# Patient Record
Sex: Female | Born: 1960 | Race: White | Hispanic: No | Smoking: Current every day smoker
Health system: Southern US, Community
[De-identification: ages and names within clinical notes are randomized; demographics above are authoritative.]

## PROBLEM LIST (undated history)

## (undated) DIAGNOSIS — F32A Depression, unspecified: Secondary | ICD-10-CM

## (undated) DIAGNOSIS — M2550 Pain in unspecified joint: Secondary | ICD-10-CM

## (undated) DIAGNOSIS — F17200 Nicotine dependence, unspecified, uncomplicated: Secondary | ICD-10-CM

## (undated) DIAGNOSIS — J45909 Unspecified asthma, uncomplicated: Secondary | ICD-10-CM

## (undated) DIAGNOSIS — M1990 Unspecified osteoarthritis, unspecified site: Secondary | ICD-10-CM

## (undated) DIAGNOSIS — H269 Unspecified cataract: Secondary | ICD-10-CM

## (undated) DIAGNOSIS — T8859XA Other complications of anesthesia, initial encounter: Secondary | ICD-10-CM

## (undated) DIAGNOSIS — F329 Major depressive disorder, single episode, unspecified: Secondary | ICD-10-CM

## (undated) DIAGNOSIS — R110 Nausea: Secondary | ICD-10-CM

## (undated) DIAGNOSIS — T4145XA Adverse effect of unspecified anesthetic, initial encounter: Secondary | ICD-10-CM

## (undated) HISTORY — DX: Depression, unspecified: F32.A

## (undated) HISTORY — DX: Unspecified asthma, uncomplicated: J45.909

## (undated) HISTORY — DX: Unspecified osteoarthritis, unspecified site: M19.90

## (undated) HISTORY — DX: Nausea: R11.0

## (undated) HISTORY — PX: CATARACT EXTRACTION W/ INTRAOCULAR LENS IMPLANT: SHX1309

## (undated) HISTORY — DX: Nicotine dependence, unspecified, uncomplicated: F17.200

## (undated) HISTORY — DX: Unspecified cataract: H26.9

## (undated) HISTORY — DX: Major depressive disorder, single episode, unspecified: F32.9

## (undated) HISTORY — PX: TONSILLECTOMY: SUR1361

## (undated) HISTORY — DX: Pain in unspecified joint: M25.50

---
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
## 1979-09-28 HISTORY — PX: APPENDECTOMY: SHX54

## 1998-09-27 HISTORY — PX: ABDOMINAL HYSTERECTOMY: SHX81

## 2014-06-06 ENCOUNTER — Ambulatory Visit: Payer: Self-pay

## 2014-06-06 DIAGNOSIS — R079 Chest pain, unspecified: Secondary | ICD-10-CM

## 2015-10-31 ENCOUNTER — Encounter: Payer: Self-pay | Admitting: Family Medicine

## 2015-10-31 ENCOUNTER — Ambulatory Visit (INDEPENDENT_AMBULATORY_CARE_PROVIDER_SITE_OTHER): Payer: Self-pay | Admitting: Family Medicine

## 2015-10-31 VITALS — BP 137/85 | HR 88 | Temp 98.8°F | Resp 16 | Ht 67.0 in | Wt 196.0 lb

## 2015-10-31 DIAGNOSIS — N6452 Nipple discharge: Secondary | ICD-10-CM

## 2015-10-31 DIAGNOSIS — J4520 Mild intermittent asthma, uncomplicated: Secondary | ICD-10-CM

## 2015-10-31 DIAGNOSIS — Z87898 Personal history of other specified conditions: Secondary | ICD-10-CM

## 2015-10-31 DIAGNOSIS — F32A Depression, unspecified: Secondary | ICD-10-CM | POA: Insufficient documentation

## 2015-10-31 DIAGNOSIS — J301 Allergic rhinitis due to pollen: Secondary | ICD-10-CM | POA: Insufficient documentation

## 2015-10-31 DIAGNOSIS — Z8679 Personal history of other diseases of the circulatory system: Secondary | ICD-10-CM | POA: Insufficient documentation

## 2015-10-31 DIAGNOSIS — F329 Major depressive disorder, single episode, unspecified: Secondary | ICD-10-CM

## 2015-10-31 DIAGNOSIS — E785 Hyperlipidemia, unspecified: Secondary | ICD-10-CM

## 2015-10-31 DIAGNOSIS — E559 Vitamin D deficiency, unspecified: Secondary | ICD-10-CM | POA: Insufficient documentation

## 2015-10-31 DIAGNOSIS — N63 Unspecified lump in unspecified breast: Secondary | ICD-10-CM

## 2015-10-31 DIAGNOSIS — Z720 Tobacco use: Secondary | ICD-10-CM

## 2015-10-31 DIAGNOSIS — R635 Abnormal weight gain: Secondary | ICD-10-CM

## 2015-10-31 MED ORDER — ALBUTEROL SULFATE HFA 108 (90 BASE) MCG/ACT IN AERS: 2.0000 | INHALATION_SPRAY | Freq: Four times a day (QID) | RESPIRATORY_TRACT | Status: DC | PRN

## 2015-10-31 MED ORDER — OLOPATADINE HCL 0.1 % OP SOLN: 1.0000 [drp] | Freq: Two times a day (BID) | OPHTHALMIC | Status: DC

## 2015-10-31 MED ORDER — CITALOPRAM HYDROBROMIDE 20 MG PO TABS: 20.0000 mg | ORAL_TABLET | Freq: Every day | ORAL | Status: DC

## 2015-10-31 NOTE — Progress Notes (Signed)
Subjective:    Patient ID: Jacqueline [name], female    DOB: 1960-10-28, 55 y.o.   MRN: [id number]  HPI: Jacqueline [name] is a 55 y.o. female presenting on 10/31/2015 for Establish Care   HPI  Pt presents to establish care today. Previous care provider was in [place] [facility]-saw family medicine in [place] for acute care visits. .  It has been 3 years since Her last PCP visit. Records from previous provider will be requested and reviewed. Current medical problems include:  Allergies- Environmental- takes OTC zyrtec during allergy season. End of February to May. Eyes itchy- crusty, itchy, watery dry. Tried moisturizer drops with allergy relief.  Asthma: Mild intermittent. Uses rescue inhaler PRN. Doesn't use often. Taking advair daily. Occasion exacerbation with URI. Possible COPD- feels short of breath with exertion.  Depression- Taken citalopram in the past. Has been off for several years. Wide mood swings- worst symptom. Trouble sleeping-early morning awakening. Drinking 5 hours energy. Tried several antidepressants in the past. Was on 2 antidepressants. Previously on Wellbutrin.  Heartburn- OTC medications- Tums. Diet controlled. Takes generic Zantac daily. No alarm symptoms. Endoscopy- within past 5 years. On gluten free diet. Unsure if they tested for celiac. Recommend gluten free diet.  History of infected lymphnodes- last 1994. Previous occipital nose infection and axillary. Nodes. Had a nose on the R breast. Pain in the R breast.  Scarring on lungs- found previous nodule. Doing yearly CT scans.   Health maintenance:  Last mammogram- 2014. No breast surgery. Occasional drainage from- clear drainage from nipple occassional. Serous drainage. Last seen 4-5 months ago.  Last pap smear: Hysterectomy- for endometriosis. 1 ovary left.  Lung issues- [facility]--  lung center. Previous name [facility]. Current smoker- 1 pack per day. 30 years of smoking. Quit before- chantix worked well.  Interested in quitting again. Would like to try medications.  Colonoscopy- 2013.  Overall healthy diet.  Stays active.   Business leader.    Past Medical History  Diagnosis Date  . Depression    Social History   Social History  . Marital Status: Single    Spouse Name: N/A  . Number of Children: N/A  . Years of Education: N/A   Occupational History  . Not on file.   Social History Main Topics  . Smoking status: Current Every Day Smoker -- 1.00 packs/day    Types: Cigarettes  . Smokeless tobacco: Not on file  . Alcohol Use: 1.2 oz/week    2 Glasses of wine per week  . Drug Use: No  . Sexual Activity: Yes   Other Topics Concern  . Not on file   Social History Narrative  . No narrative on file   Family History  Problem Relation Age of Onset  . Adopted: Yes   No current outpatient prescriptions on file prior to visit.   No current facility-administered medications on file prior to visit.    Review of Systems  Constitutional: Negative for fever and chills.  HENT: Negative.   Respiratory: Negative for cough, chest tightness and wheezing.   Cardiovascular: Negative for chest pain and leg swelling.  Gastrointestinal: Negative for nausea, vomiting, abdominal pain, diarrhea and constipation.  Endocrine: Negative.  Negative for cold intolerance, heat intolerance, polydipsia, polyphagia and polyuria.  Genitourinary: Negative for dysuria and difficulty urinating.  Musculoskeletal: Negative.   Neurological: Negative for dizziness, light-headedness and numbness.  Psychiatric/Behavioral: Positive for sleep disturbance, dysphoric mood and decreased concentration.   Per HPI unless specifically indicated  above     Objective:    BP 137/85 mmHg  Pulse 88  Temp(Src) 98.8 F (37.1 C) (Oral)  Resp 16  Ht  (1.702 m)  Wt 196 lb (88.905 kg)  BMI 30.69 kg/m2  Wt Readings from Last 3 Encounters:  10/31/15 196 lb (88.905 kg)    Physical Exam  Constitutional: She is  oriented to person, place, and time. She appears well-developed and well-nourished.  HENT:  Head: Normocephalic and atraumatic.  Neck: Neck supple.  Cardiovascular: Normal rate, regular rhythm and normal heart sounds.  Exam reveals no gallop and no friction rub.   No murmur heard. Pulmonary/Chest: Effort normal and breath sounds normal. She has no wheezes. She exhibits no tenderness. Right breast exhibits mass (mass vs. dense tissue.), nipple discharge and tenderness. Right breast exhibits no inverted nipple. Left breast exhibits no inverted nipple, no mass, no nipple discharge, no skin change and no tenderness. Breasts are symmetrical.  Abdominal: Soft. Normal appearance and bowel sounds are normal. She exhibits no distension and no mass. There is no tenderness. There is no rebound and no guarding.  Musculoskeletal: Normal range of motion. She exhibits no edema or tenderness.  Lymphadenopathy:    She has no cervical adenopathy.  Neurological: She is alert and oriented to person, place, and time.  Skin: Skin is warm and dry.  Psychiatric: She has a normal mood and affect. Her speech is normal and behavior is normal. Thought content normal. Cognition and memory are normal.   No results found for this or any previous visit.    Assessment & Plan:   Problem List Items Addressed This Visit      Respiratory   Mild intermittent asthma    Continue Advair daily. Filled PRN inhaler. Consider spirometry next visit to r/o COPD.       Relevant Medications   fluticasone-salmeterol (ADVAIR HFA) 115-21 MCG/ACT inhaler   albuterol (PROVENTIL HFA;VENTOLIN HFA) 108 (90 Base) MCG/ACT inhaler   Other Relevant Orders   Comprehensive Metabolic Panel (CMET)   CBC with Differential/Platelet   Allergic rhinitis due to pollen    Patanol eye drops for eye itching. PRN oral anti-histamine.       Relevant Medications   olopatadine (PATANOL) 0.1 % ophthalmic solution     Other   Depression - Primary     Restart Celexa today. Consider adding wellbutrin.       Relevant Medications   citalopram (CELEXA) 20 MG tablet   History of lymphadenitis   Relevant Orders   CBC with Differential/Platelet   Tobacco use    Tobacco counseling given. Plan to start medication once depression is under control.       Vitamin D deficiency   Relevant Orders   VITAMIN D 25 Hydroxy (Vit-D Deficiency, Fractures)    Other Visit Diagnoses    Breast lump in female        [place] and diagnostic mammo.     Relevant Orders    US BREAST LTD UNI LEFT INC AXILLA    US BREAST LTD UNI RIGHT INC AXILLA    MM Digital Diagnostic Bilat    Abnormal weight gain        Check TSH.     Relevant Orders    TSH    Mild hyperlipidemia        Check lipid panel.     Relevant Orders    Lipid Profile    Nipple discharge in female        Breast  ultrasound and diagnostic mammogram to rule out abcess vs. malignancy.     Relevant Orders    US BREAST LTD UNI LEFT INC AXILLA    US BREAST LTD UNI RIGHT INC AXILLA    MM Digital Diagnostic Bilat       Meds ordered this encounter  Medications  . fluticasone-salmeterol (ADVAIR HFA) 115-21 MCG/ACT inhaler    Sig: Inhale 2 puffs into the lungs 2 (two) times daily. Once a day but sometimes twice a day  . Multiple Vitamin (MULTIVITAMIN) tablet    Sig: Take 1 tablet by mouth daily.  . citalopram (CELEXA) 20 MG tablet    Sig: Take 1 tablet (20 mg total) by mouth daily.    Dispense:  30 tablet    Refill:  11    Order Specific Question:  Supervising Provider    Answer:  Janeann Forehand [phone]  . olopatadine (PATANOL) 0.1 % ophthalmic solution    Sig: Place 1 drop into both eyes 2 (two) times daily.    Dispense:  5 mL    Refill:  12    Order Specific Question:  Supervising Provider    Answer:  Janeann Forehand [phone]  . albuterol (PROVENTIL HFA;VENTOLIN HFA) 108 (90 Base) MCG/ACT inhaler    Sig: Inhale 2 puffs into the lungs every 6 (six) hours as needed for wheezing or  shortness of breath.    Dispense:  1 Inhaler    Refill:  11    Order Specific Question:  Supervising Provider    Answer:  Janeann Forehand [phone]  . ranitidine (ZANTAC) 75 MG tablet    Sig: Take 75 mg by mouth daily.  . cetirizine (ZYRTEC) 10 MG tablet    Sig: Take 10 mg by mouth daily.      Follow up plan: Return in about 4 weeks (around 11/28/2015) for depression. Marland Kitchen

## 2015-10-31 NOTE — Assessment & Plan Note (Signed)
Continue Advair daily. Filled PRN inhaler. Consider spirometry next visit to r/o COPD.

## 2015-10-31 NOTE — Assessment & Plan Note (Signed)
Restart Celexa today. Consider adding wellbutrin.

## 2015-10-31 NOTE — Assessment & Plan Note (Signed)
Tobacco counseling given. Plan to start medication once depression is under control.

## 2015-10-31 NOTE — Patient Instructions (Addendum)
Depression: Let's start you back on your Citalopram. Start by taking 1/2 tablet at bedtime for 4 days and then resume full dose. This decreases the side effects. Try a 2000IU vitamin D over the counter to help with mood and energy. 30 minutes of exercise daily can help with mood as well.  We will obtain imaging of your breast at [facility]. They will contact you for an appt.  I have sent in Eye drops for your allergies. Take 1 drop each eye twice daily.

## 2015-10-31 NOTE — Assessment & Plan Note (Signed)
Patanol eye drops for eye itching. PRN oral anti-histamine.

## 2015-11-01 LAB — COMPREHENSIVE METABOLIC PANEL
A/G RATIO: 1.8 (ref 1.1–2.5)
ALT: 28 IU/L (ref 0–32)
AST: 22 IU/L (ref 0–40)
Albumin: 4.2 g/dL (ref 3.5–5.5)
Alkaline Phosphatase: 93 IU/L (ref 39–117)
BILIRUBIN TOTAL: 0.3 mg/dL (ref 0.0–1.2)
BUN/Creatinine Ratio: 23 (ref 9–23)
BUN: 17 mg/dL (ref 6–24)
CALCIUM: 9.8 mg/dL (ref 8.7–10.2)
CHLORIDE: 102 mmol/L (ref 96–106)
CO2: 25 mmol/L (ref 18–29)
Creatinine, Ser: 0.74 mg/dL (ref 0.57–1.00)
GFR calc non Af Amer: 91 mL/min/{1.73_m2} (ref >59)
GFR, EST AFRICAN AMERICAN: 105 mL/min/{1.73_m2} (ref >59)
GLOBULIN, TOTAL: 2.4 g/dL (ref 1.5–4.5)
Glucose: 92 mg/dL (ref 65–99)
POTASSIUM: 3.9 mmol/L (ref 3.5–5.2)
SODIUM: 144 mmol/L (ref 134–144)
Total Protein: 6.6 g/dL (ref 6.0–8.5)

## 2015-11-01 LAB — CBC WITH DIFFERENTIAL/PLATELET
BASOS: 1 %
Basophils Absolute: 0.1 10*3/uL (ref 0.0–0.2)
EOS (ABSOLUTE): 0.3 10*3/uL (ref 0.0–0.4)
Eos: 3 %
HEMOGLOBIN: 12.1 g/dL (ref 11.1–15.9)
Hematocrit: 36 % (ref 34.0–46.6)
Immature Grans (Abs): 0 10*3/uL (ref 0.0–0.1)
Immature Granulocytes: 0 %
LYMPHS ABS: 4.4 10*3/uL — AB (ref 0.7–3.1)
Lymphs: 34 %
MCH: 29.4 pg (ref 26.6–33.0)
MCHC: 33.6 g/dL (ref 31.5–35.7)
MCV: 88 fL (ref 79–97)
MONOCYTES: 6 %
Monocytes Absolute: 0.8 10*3/uL (ref 0.1–0.9)
NEUTROS ABS: 7.4 10*3/uL — AB (ref 1.4–7.0)
Neutrophils: 56 %
Platelets: 276 10*3/uL (ref 150–379)
RBC: 4.11 x10E6/uL (ref 3.77–5.28)
RDW: 13.8 % (ref 12.3–15.4)
WBC: 13.1 10*3/uL — ABNORMAL HIGH (ref 3.4–10.8)

## 2015-11-01 LAB — LIPID PANEL
CHOL/HDL RATIO: 5.3 ratio — AB (ref 0.0–4.4)
CHOLESTEROL TOTAL: 200 mg/dL — AB (ref 100–199)
HDL: 38 mg/dL — ABNORMAL LOW (ref >39)
LDL Calculated: 105 mg/dL — ABNORMAL HIGH (ref 0–99)
Triglycerides: 287 mg/dL — ABNORMAL HIGH (ref 0–149)
VLDL CHOLESTEROL CAL: 57 mg/dL — AB (ref 5–40)

## 2015-11-01 LAB — VITAMIN D 25 HYDROXY (VIT D DEFICIENCY, FRACTURES): Vit D, 25-Hydroxy: 23.3 ng/mL — ABNORMAL LOW (ref 30.0–100.0)

## 2015-11-01 LAB — TSH: TSH: 2.52 u[IU]/mL (ref 0.450–4.500)

## 2015-11-04 ENCOUNTER — Other Ambulatory Visit: Payer: Self-pay | Admitting: Family Medicine

## 2015-11-04 DIAGNOSIS — E559 Vitamin D deficiency, unspecified: Secondary | ICD-10-CM

## 2015-11-04 MED ORDER — VITAMIN D (ERGOCALCIFEROL) 1.25 MG (50000 UNIT) PO CAPS: 50000.0000 [IU] | ORAL_CAPSULE | ORAL | Status: DC

## 2015-11-05 ENCOUNTER — Telehealth: Payer: Self-pay | Admitting: Family Medicine

## 2015-11-05 DIAGNOSIS — E785 Hyperlipidemia, unspecified: Secondary | ICD-10-CM

## 2015-11-05 MED ORDER — ATORVASTATIN CALCIUM 20 MG PO TABS: 20.0000 mg | ORAL_TABLET | Freq: Every day | ORAL | Status: DC

## 2015-11-05 NOTE — Telephone Encounter (Signed)
-----   Message from Lavinia Sharps, CMA sent at 11/05/2015 10:43 AM EST ----- Pt would like to start medication  to lower cholesterol please suggest which one we can send ?

## 2015-11-05 NOTE — Telephone Encounter (Signed)
Left detailed message.   

## 2015-11-05 NOTE — Telephone Encounter (Signed)
Please let her know atorvastatin  was sent the pharmacy on file. Take once daily. Thanks! AK

## 2015-11-13 ENCOUNTER — Telehealth: Payer: Self-pay | Admitting: *Deleted

## 2015-11-13 NOTE — Telephone Encounter (Signed)
Patient needs to go by Jacqueline [name] to sign a release to have last mammogram results released before scheduling appointment.

## 2015-11-13 NOTE — Telephone Encounter (Signed)
Patient was contacted by Delford Field and made aware.[place]

## 2015-11-26 ENCOUNTER — Other Ambulatory Visit: Payer: Self-pay | Admitting: *Deleted

## 2015-11-26 ENCOUNTER — Inpatient Hospital Stay
Admission: RE | Admit: 2015-11-26 | Discharge: 2015-11-26 | Disposition: A | Discharge status: Home / Self Care | Payer: Self-pay | Source: Ambulatory Visit | Attending: *Deleted | Admitting: *Deleted

## 2015-11-26 DIAGNOSIS — Z9289 Personal history of other medical treatment: Secondary | ICD-10-CM

## 2015-11-28 ENCOUNTER — Ambulatory Visit: Payer: Self-pay | Admitting: Family Medicine

## 2015-12-08 ENCOUNTER — Ambulatory Visit
Admission: RE | Admit: 2015-12-08 | Discharge: 2015-12-08 | Disposition: A | Discharge status: Home / Self Care | Payer: 59 | Source: Ambulatory Visit | Attending: Family Medicine | Admitting: Family Medicine

## 2015-12-08 ENCOUNTER — Other Ambulatory Visit: Payer: Self-pay | Admitting: Family Medicine

## 2015-12-08 DIAGNOSIS — N63 Unspecified lump in unspecified breast: Secondary | ICD-10-CM

## 2015-12-08 DIAGNOSIS — N6452 Nipple discharge: Secondary | ICD-10-CM

## 2015-12-25 ENCOUNTER — Other Ambulatory Visit: Payer: Self-pay | Admitting: Family Medicine

## 2015-12-25 DIAGNOSIS — F329 Major depressive disorder, single episode, unspecified: Secondary | ICD-10-CM

## 2015-12-25 DIAGNOSIS — F32A Depression, unspecified: Secondary | ICD-10-CM

## 2015-12-25 MED ORDER — CITALOPRAM HYDROBROMIDE 20 MG PO TABS: 20.0000 mg | ORAL_TABLET | Freq: Every day | ORAL | Status: DC

## 2015-12-25 NOTE — Telephone Encounter (Signed)
Patient/pharmacy requesting 90 rx in place of 11 refills.

## 2016-01-25 ENCOUNTER — Other Ambulatory Visit: Payer: Self-pay | Admitting: Family Medicine

## 2016-02-06 ENCOUNTER — Other Ambulatory Visit: Payer: Self-pay | Admitting: Family Medicine

## 2016-02-06 DIAGNOSIS — F329 Major depressive disorder, single episode, unspecified: Secondary | ICD-10-CM

## 2016-02-06 DIAGNOSIS — F32A Depression, unspecified: Secondary | ICD-10-CM

## 2016-02-06 DIAGNOSIS — E785 Hyperlipidemia, unspecified: Secondary | ICD-10-CM

## 2016-02-06 MED ORDER — CITALOPRAM HYDROBROMIDE 20 MG PO TABS: 20.0000 mg | ORAL_TABLET | Freq: Every day | ORAL | Status: DC

## 2016-02-06 MED ORDER — ATORVASTATIN CALCIUM 20 MG PO TABS: 20.0000 mg | ORAL_TABLET | Freq: Every day | ORAL | Status: DC

## 2016-03-03 ENCOUNTER — Ambulatory Visit
Admission: RE | Admit: 2016-03-03 | Discharge: 2016-03-03 | Disposition: A | Discharge status: Home / Self Care | Payer: 59 | Source: Ambulatory Visit | Attending: Family Medicine | Admitting: Family Medicine

## 2016-03-03 ENCOUNTER — Ambulatory Visit (INDEPENDENT_AMBULATORY_CARE_PROVIDER_SITE_OTHER): Payer: 59 | Admitting: Family Medicine

## 2016-03-03 ENCOUNTER — Encounter: Payer: Self-pay | Admitting: Family Medicine

## 2016-03-03 VITALS — BP 113/47 | HR 98 | Temp 98.7°F | Resp 16 | Ht 67.0 in | Wt 193.0 lb

## 2016-03-03 DIAGNOSIS — J181 Lobar pneumonia, unspecified organism: Secondary | ICD-10-CM

## 2016-03-03 DIAGNOSIS — F329 Major depressive disorder, single episode, unspecified: Secondary | ICD-10-CM | POA: Diagnosis not present

## 2016-03-03 DIAGNOSIS — J189 Pneumonia, unspecified organism: Secondary | ICD-10-CM | POA: Diagnosis not present

## 2016-03-03 DIAGNOSIS — J4521 Mild intermittent asthma with (acute) exacerbation: Secondary | ICD-10-CM

## 2016-03-03 DIAGNOSIS — Z720 Tobacco use: Secondary | ICD-10-CM

## 2016-03-03 DIAGNOSIS — F32A Depression, unspecified: Secondary | ICD-10-CM

## 2016-03-03 MED ORDER — CITALOPRAM HYDROBROMIDE 20 MG PO TABS: 10.0000 mg | ORAL_TABLET | Freq: Every day | ORAL | Status: DC

## 2016-03-03 MED ORDER — VARENICLINE TARTRATE 0.5 MG X 11 & 1 MG X 42 PO MISC: ORAL | Status: DC

## 2016-03-03 MED ORDER — IPRATROPIUM-ALBUTEROL 0.5-2.5 (3) MG/3ML IN SOLN: 3.0000 mL | Freq: Once | RESPIRATORY_TRACT | Status: DC

## 2016-03-03 MED ORDER — LEVOFLOXACIN 750 MG PO TABS: 750.0000 mg | ORAL_TABLET | Freq: Every day | ORAL | Status: DC

## 2016-03-03 MED ORDER — ALBUTEROL SULFATE (2.5 MG/3ML) 0.083% IN NEBU: 2.5000 mg | INHALATION_SOLUTION | Freq: Four times a day (QID) | RESPIRATORY_TRACT | Status: DC | PRN

## 2016-03-03 MED ORDER — PREDNISONE 20 MG PO TABS: 40.0000 mg | ORAL_TABLET | Freq: Every day | ORAL | Status: DC

## 2016-03-03 NOTE — Progress Notes (Signed)
Subjective:    Patient ID: Jacqueline [name], female    DOB: 09-Oct-1960, 55 y.o.   MRN: [id number]  HPI: Jacqueline [name] is a 55 y.o. female presenting on 03/03/2016 for Nasal Congestion   HPI   Patient presents today with complaints of chest congestion and sore throat that started last week (Wed) and has progressively getting worse. Cough that has mainly been dry but has had some productive cough with green sputum. Chest discomfort, shortness of breath and wheezing, headache and fever/chills yesterday. Has used OTC Mucinex, Robitussin, vicks and her Inhalers. Been having to use her inhalers a lot more over the past several days with no improvement.   Shortness of breath with any movement and patient complains that she if feeling very fatigued.    Past Medical History  Diagnosis Date  . Depression     Current Outpatient Prescriptions on File Prior to Visit  Medication Sig  . albuterol (PROVENTIL HFA;VENTOLIN HFA) 108 (90 Base) MCG/ACT inhaler Inhale 2 puffs into the lungs every 6 (six) hours as needed for wheezing or shortness of breath.  Marland Kitchen atorvastatin (LIPITOR) 20 MG tablet Take 1 tablet (20 mg total) by mouth daily.  . cetirizine (ZYRTEC) 10 MG tablet Take 10 mg by mouth daily.  . fluticasone-salmeterol (ADVAIR HFA) 115-21 MCG/ACT inhaler Inhale 2 puffs into the lungs 2 (two) times daily. Once a day but sometimes twice a day  . Multiple Vitamin (MULTIVITAMIN) tablet Take 1 tablet by mouth daily.  Marland Kitchen olopatadine (PATANOL) 0.1 % ophthalmic solution Place 1 drop into both eyes 2 (two) times daily.  . ranitidine (ZANTAC) 75 MG tablet Take 75 mg by mouth daily.  . Vitamin D, Ergocalciferol, (DRISDOL) 50000 units CAPS capsule TAKE 1 CAPSULE (50,000 UNITS TOTAL) BY MOUTH EVERY 7 (SEVEN) DAYS.   No current facility-administered medications on file prior to visit.    Review of Systems  Constitutional: Positive for fever, chills, activity change and fatigue. Negative for appetite  change.  HENT: Positive for congestion and sore throat. Negative for ear pain, nosebleeds and rhinorrhea.   Eyes: Negative for redness and itching.  Respiratory: Positive for cough, chest tightness, shortness of breath and wheezing.   Cardiovascular: Negative for chest pain, palpitations and leg swelling.  Gastrointestinal: Negative for nausea, vomiting, abdominal pain, diarrhea and constipation.  Endocrine: Negative.  Negative for cold intolerance, heat intolerance, polydipsia, polyphagia and polyuria.  Genitourinary: Negative for dysuria and difficulty urinating.  Musculoskeletal: Positive for arthralgias.  Skin: Negative for color change.  Neurological: Positive for weakness and headaches. Negative for dizziness, light-headedness and numbness.  Psychiatric/Behavioral: Negative.    Per HPI unless specifically indicated above     Objective:    BP 113/47 mmHg  Pulse 98  Temp(Src) 98.7 F (37.1 C) (Oral)  Resp 16  Ht  (1.702 m)  Wt 193 lb (87.544 kg)  BMI 30.22 kg/m2  SpO2 98%  Wt Readings from Last 3 Encounters:  03/03/16 193 lb (87.544 kg)  10/31/15 196 lb (88.905 kg)    Physical Exam  Constitutional: She is oriented to person, place, and time. She appears well-developed and well-nourished.  HENT:  Head: Normocephalic and atraumatic.  Eyes: Conjunctivae are normal. Right eye exhibits no discharge. Left eye exhibits no discharge.  Neck: Neck supple.  Cardiovascular: Normal rate, regular rhythm and normal heart sounds.  Exam reveals no gallop and no friction rub.   No murmur heard. Pulmonary/Chest: No accessory muscle usage. No respiratory distress. She has wheezes in  the right upper field, the right middle field, the right lower field, the left upper field, the left middle field and the left lower field. She exhibits no tenderness.  Initial accessory muscle usage resolved after nebulizer given in office. Work of breathing much reduced after albuterol treatment.  Inspiratory wheezes reduced after neb.   Abdominal: Soft. Normal appearance and bowel sounds are normal. She exhibits no distension and no mass. There is no tenderness. There is no rebound and no guarding.  Musculoskeletal: Normal range of motion. She exhibits no edema or tenderness.  Lymphadenopathy:    She has no cervical adenopathy.  Neurological: She is alert and oriented to person, place, and time.  Skin: Skin is warm and dry.   Results for orders placed or performed in visit on 10/31/15  Lipid Profile  Result Value Ref Range   Cholesterol, Total 200 (H) 100 - 199 mg/dL   Triglycerides 478287 (H) 0 - 149 mg/dL   HDL 38 (L) >29>39 mg/dL   VLDL Cholesterol Cal 57 (H) 5 - 40 mg/dL   LDL Calculated 562105 (H) 0 - 99 mg/dL   Chol/HDL Ratio 5.3 (H) 0.0 - 4.4 ratio units  Comprehensive Metabolic Panel (CMET)  Result Value Ref Range   Glucose 92 65 - 99 mg/dL   BUN 17 6 - 24 mg/dL   Creatinine, Ser 1.300.74 0.57 - 1.00 mg/dL   GFR calc non Af Amer 91 >59 mL/min/1.73   GFR calc Af Amer 105 >59 mL/min/1.73   BUN/Creatinine Ratio 23 9 - 23   Sodium 144 134 - 144 mmol/L   Potassium 3.9 3.5 - 5.2 mmol/L   Chloride 102 96 - 106 mmol/L   CO2 25 18 - 29 mmol/L   Calcium 9.8 8.7 - 10.2 mg/dL   Total Protein 6.6 6.0 - 8.5 g/dL   Albumin 4.2 3.5 - 5.5 g/dL   Globulin, Total 2.4 1.5 - 4.5 g/dL   Albumin/Globulin Ratio 1.8 1.1 - 2.5   Bilirubin Total 0.3 0.0 - 1.2 mg/dL   Alkaline Phosphatase 93 39 - 117 IU/L   AST 22 0 - 40 IU/L   ALT 28 0 - 32 IU/L  VITAMIN D 25 Hydroxy (Vit-D Deficiency, Fractures)  Result Value Ref Range   Vit D, 25-Hydroxy 23.3 (L) 30.0 - 100.0 ng/mL  TSH  Result Value Ref Range   TSH 2.520 0.450 - 4.500 uIU/mL  CBC with Differential/Platelet  Result Value Ref Range   WBC 13.1 (H) 3.4 - 10.8 x10E3/uL   RBC 4.11 3.77 - 5.28 x10E6/uL   Hemoglobin 12.1 11.1 - 15.9 g/dL   Hematocrit 86.536.0 78.434.0 - 46.6 %   MCV 88 79 - 97 fL   MCH 29.4 26.6 - 33.0 pg   MCHC 33.6 31.5 - 35.7 g/dL     RDW 69.613.8 29.512.3 - 28.415.4 %   Platelets 276 150 - 379 x10E3/uL   Neutrophils 56 %   Lymphs 34 %   Monocytes 6 %   Eos 3 %   Basos 1 %   Neutrophils Absolute 7.4 (H) 1.4 - 7.0 x10E3/uL   Lymphocytes Absolute 4.4 (H) 0.7 - 3.1 x10E3/uL   Monocytes Absolute 0.8 0.1 - 0.9 x10E3/uL   EOS (ABSOLUTE) 0.3 0.0 - 0.4 x10E3/uL   Basophils Absolute 0.1 0.0 - 0.2 x10E3/uL   Immature Granulocytes 0 %   Immature Grans (Abs) 0.0 0.0 - 0.1 x10E3/uL      Assessment & Plan:   Problem List Items Addressed This Visit  Respiratory   Mild intermittent asthma - Primary    Treat for exacerbation today. Prednisone to ease breathing. Albuterol nebs PRN at home. Reviewed alarm symptoms. Return if not improving.       Relevant Medications   ipratropium-albuterol (DUONEB) 0.5-2.5 (3) MG/3ML nebulizer solution 3 mL   albuterol (PROVENTIL) (2.5 MG/3ML) 0.083% nebulizer solution   predniSONE (DELTASONE) 20 MG tablet   Other Relevant Orders   DG Chest 2 View (Completed)   DME Nebulizer machine     Other   Depression    Patient only taking 1/2 tablet of her citalopram. Doing well. Per PharmD recommendation will hold citalopram 10mg  while on levaquin for pneumonia.       Relevant Medications   citalopram (CELEXA) 20 MG tablet   Tobacco use    Recommend smoking cessation. Will start Chantix following treatment for pneumonia.       Relevant Medications   varenicline (CHANTIX PAK) 0.5 MG X 11 & 1 MG X 42 tablet    Other Visit Diagnoses    RLL pneumonia        Treati with levaquin due to potential DRSP pneumonia. Discussed interaction with citalopram with pharmacist. She will advise pt to hold citalopram.     Relevant Medications    ipratropium-albuterol (DUONEB) 0.5-2.5 (3) MG/3ML nebulizer solution 3 mL    albuterol (PROVENTIL) (2.5 MG/3ML) 0.083% nebulizer solution    levofloxacin (LEVAQUIN) 750 MG tablet       Meds ordered this encounter  Medications  . ipratropium-albuterol (DUONEB) 0.5-2.5  (3) MG/3ML nebulizer solution 3 mL    Sig:   . albuterol (PROVENTIL) (2.5 MG/3ML) 0.083% nebulizer solution    Sig: Take 3 mLs (2.5 mg total) by nebulization every 6 (six) hours as needed for wheezing or shortness of breath.    Dispense:  150 mL    Refill:  1    Order Specific Question:  Supervising Provider    Answer:  Janeann Forehand [[id number]]  . predniSONE (DELTASONE) 20 MG tablet    Sig: Take 2 tablets (40 mg total) by mouth daily with breakfast.    Dispense:  10 tablet    Refill:  0    Order Specific Question:  Supervising Provider    Answer:  Janeann Forehand [phone]  . varenicline (CHANTIX PAK) 0.5 MG X 11 & 1 MG X 42 tablet    Sig: Take one 0.5 mg tablet by mouth once daily for 3 days, then increase to 0.5 mg tablet twice daily for 4 days, then increase to 1 mg tablet twice daily.    Dispense:  53 tablet    Refill:  0    Order Specific Question:  Supervising Provider    Answer:  Janeann Forehand [[id number]]  . citalopram (CELEXA) 20 MG tablet    Sig: Take 0.5 tablets (10 mg total) by mouth daily.    Dispense:  90 tablet    Refill:  3    Order Specific Question:  Supervising Provider    Answer:  Janeann Forehand [phone]  . levofloxacin (LEVAQUIN) 750 MG tablet    Sig: Take 1 tablet (750 mg total) by mouth daily.    Dispense:  5 tablet    Refill:  0    Order Specific Question:  Supervising Provider    Answer:  Janeann Forehand [[id number]]      Follow up plan: Return in about 4 weeks (around 03/31/2016) for follow-up pneumonia. Marland Kitchen

## 2016-03-03 NOTE — Assessment & Plan Note (Signed)
Recommend smoking cessation. Will start Chantix following treatment for pneumonia.

## 2016-03-03 NOTE — Patient Instructions (Addendum)
You have pneumonia today. Please take Levaquin once daily for your pneumonia for 5 days. Also take prednisone once daily for 5 days.  Please seek immediate medical attention if you develop shortness of breath not relieve by inhaler, chest pain/tightness, fever > 103 F or other concerning symptoms.   There is a chance your anti-depressant and your antibiotic can interact. Because you have no heart issues, this chance is low. However, if you have chest pain, feel dizzy, heart feels like it is fluttering, seek immediate medical attention.  The pharmacist has recommended that you stop citalopram for the 5 days of antibiotic.

## 2016-03-03 NOTE — Assessment & Plan Note (Addendum)
Treat for exacerbation today. Prednisone to ease breathing. Albuterol nebs PRN at home. Reviewed alarm symptoms. Return if not improving.

## 2016-03-03 NOTE — Assessment & Plan Note (Signed)
Patient only taking 1/2 tablet of her citalopram. Doing well. Per PharmD recommendation will hold citalopram 10mg  while on levaquin for pneumonia.

## 2016-05-08 ENCOUNTER — Other Ambulatory Visit: Payer: Self-pay | Admitting: Family Medicine

## 2016-05-08 DIAGNOSIS — Z720 Tobacco use: Secondary | ICD-10-CM

## 2016-11-11 ENCOUNTER — Ambulatory Visit (INDEPENDENT_AMBULATORY_CARE_PROVIDER_SITE_OTHER): Payer: 59 | Admitting: Family Medicine

## 2016-11-11 ENCOUNTER — Encounter: Payer: Self-pay | Admitting: Family Medicine

## 2016-11-11 VITALS — BP 135/80 | HR 79 | Temp 98.5°F | Resp 16 | Ht 67.0 in | Wt 194.0 lb

## 2016-11-11 DIAGNOSIS — L409 Psoriasis, unspecified: Secondary | ICD-10-CM | POA: Insufficient documentation

## 2016-11-11 DIAGNOSIS — L309 Dermatitis, unspecified: Secondary | ICD-10-CM

## 2016-11-11 DIAGNOSIS — R21 Rash and other nonspecific skin eruption: Secondary | ICD-10-CM

## 2016-11-11 NOTE — Progress Notes (Signed)
Subjective:    Patient ID: Jacqueline [name], female    DOB: [birth year], 56 y.o.   MRN: [id number]  Jacqueline [name] is a 56 y.o. female presenting on 11/11/2016 for Rash (spots on neck)  HPI  Rash on Neck (Chronic, annular vs reticular appearance) / Chronic Psoriasis and Eczema: - Presents for initial visit today for several dermatologic complaints, no prior evaluation or management for these problems. Describes primary concern with raised slightly red circular to lacy rash on anterior neck, started almost 1 year ago (end of Summer 2017) with only one spot in midline, without any significant associated symptoms at onset, and has not changed since first appearance. Now with worsening extension of similar rash across neck bilaterally, without other body involvement. Has not tried any topical treatments and has not seen Dermatology or PCP. - Additionally has concerns with chronic Psoriasis of scalp with thickening dry flaky skin and patches of rash, sometimes will get rash on face as well. Uses topical OTC [employer] products for this. Also with Left hand 1st and 2nd digit knuckle with thick dry skin and some callus formation, often will have some itching and dry patches consistent with eczema, tried topical moisturizers without significant improvement - Unsure of any potential provoking factors or exposures, but she does work on a horse farm, and frequently rides, she wears riding gloves that cause some irritation to her hands. Wears occasionally necklaces with mostly cross to church, otherwise fine jewelry but not daily, has never had reaction to jewelry before - Admits some history of frequent sweating - Denies any other rashes or other skin abnormalities, itching, skin pain, spreading redness, systemic symptoms fevers, chills, sweats, unintentional weight loss, nausea, vomiting, joint pain or swelling   Social History  Substance Use Topics  . Smoking status: Current Every Day Smoker   Packs/day: 1.00    Types: Cigarettes  . Smokeless tobacco: Current User  . Alcohol use 1.2 oz/week    2 Glasses of wine per week     Comment: glass of wine once a month    Review of Systems Per HPI unless specifically indicated above     Objective:    BP 135/80   Pulse 79   Temp 98.5 F (36.9 C) (Oral)   Resp 16   Ht 5\' 7"  (1.702 m)   Wt 194 lb (88 kg)   BMI 30.38 kg/m   Wt Readings from Last 3 Encounters:  11/11/16 194 lb (88 kg)  03/03/16 193 lb (87.5 kg)  10/31/15 196 lb (88.9 kg)    Physical Exam  Constitutional: She appears well-developed and well-nourished. No distress.  Well-appearing, comfortable, cooperative  HENT:  Head: Normocephalic and atraumatic.  Mouth/Throat: Oropharynx is clear and moist.  Skin: Skin is warm and dry. Rash noted. She is not diaphoretic. No erythema.  Neck: - anterior aspect with several annular vs reticular raised slightly erythematous ring-like rash lesions approx 1 x 1 to 1.5 to 2 cm with central clearing, blanching on palpation, non tender no associated edema or erythema. Localized anterior neck at chest in sternal notch and clavicular region, see picture  Left Hand: - 1x1 cm thicker dry plaque between 2nd and 3rd knuckles with some superficial skin abrasion, non tender  Nursing note and vitals reviewed.         I have personally reviewed the following lab results from 10/2015.  Results for orders placed or performed in visit on 10/31/15  Lipid Profile  Result Value Ref  Range   Cholesterol, Total 200 (H) 100 - 199 mg/dL   Triglycerides 161 (H) 0 - 149 mg/dL   HDL 38 (L) >09 mg/dL   VLDL Cholesterol Cal 57 (H) 5 - 40 mg/dL   LDL Calculated 604 (H) 0 - 99 mg/dL   Chol/HDL Ratio 5.3 (H) 0.0 - 4.4 ratio units  Comprehensive Metabolic Panel (CMET)  Result Value Ref Range   Glucose 92 65 - 99 mg/dL   BUN 17 6 - 24 mg/dL   Creatinine, Ser 5.40 0.57 - 1.00 mg/dL   GFR calc non Af Amer 91 >59 mL/min/1.73   GFR calc Af Amer 105  >59 mL/min/1.73   BUN/Creatinine Ratio 23 9 - 23   Sodium 144 134 - 144 mmol/L   Potassium 3.9 3.5 - 5.2 mmol/L   Chloride 102 96 - 106 mmol/L   CO2 25 18 - 29 mmol/L   Calcium 9.8 8.7 - 10.2 mg/dL   Total Protein 6.6 6.0 - 8.5 g/dL   Albumin 4.2 3.5 - 5.5 g/dL   Globulin, Total 2.4 1.5 - 4.5 g/dL   Albumin/Globulin Ratio 1.8 1.1 - 2.5   Bilirubin Total 0.3 0.0 - 1.2 mg/dL   Alkaline Phosphatase 93 39 - 117 IU/L   AST 22 0 - 40 IU/L   ALT 28 0 - 32 IU/L  VITAMIN D 25 Hydroxy (Vit-D Deficiency, Fractures)  Result Value Ref Range   Vit D, 25-Hydroxy 23.3 (L) 30.0 - 100.0 ng/mL  TSH  Result Value Ref Range   TSH 2.520 0.450 - 4.500 uIU/mL  CBC with Differential/Platelet  Result Value Ref Range   WBC 13.1 (H) 3.4 - 10.8 x10E3/uL   RBC 4.11 3.77 - 5.28 x10E6/uL   Hemoglobin 12.1 11.1 - 15.9 g/dL   Hematocrit 98.1 19.1 - 46.6 %   MCV 88 79 - 97 fL   MCH 29.4 26.6 - 33.0 pg   MCHC 33.6 31.5 - 35.7 g/dL   RDW 47.8 29.5 - 62.1 %   Platelets 276 150 - 379 x10E3/uL   Neutrophils 56 %   Lymphs 34 %   Monocytes 6 %   Eos 3 %   Basos 1 %   Neutrophils Absolute 7.4 (H) 1.4 - 7.0 x10E3/uL   Lymphocytes Absolute 4.4 (H) 0.7 - 3.1 x10E3/uL   Monocytes Absolute 0.8 0.1 - 0.9 x10E3/uL   EOS (ABSOLUTE) 0.3 0.0 - 0.4 x10E3/uL   Basophils Absolute 0.1 0.0 - 0.2 x10E3/uL   Immature Granulocytes 0 %   Immature Grans (Abs) 0.0 0.0 - 0.1 x10E3/uL      Assessment & Plan:   Problem List Items Addressed This Visit    Psoriasis    Stable, chronic problem with psoriasis mostly scalp dermatitis - Has only been on topical OTC acne / skin cleansing treatments - No other topical or other therapies used  Plan: 1. Referral to [facility] to establish for chronic management of Psoriasis, among other more recent Derm concerns today. Will defer starting new topical treatments for this today      Relevant Orders   Ambulatory referral to Dermatology    Other Visit Diagnoses     Rash of neck    -  Primary  Unclear exact etiology, appearance seems to be more annular vs reticular with raised lacy aspect, odd that it is asymptomatic and localized to anterior neck only. - Differential is broad, considered contact dermatitis, however only infrequently wears necklace and appearance not consistent with contact derm, considered  tinea corporis again appearance not consistent no main lesion without scaling or itching, possibly form of known psoriasis vs nummular eczema  Plan: 1. [facility] for further evaluation and diagnostic work-up, may require punch biopsy 2. Offered topical steroid empiric therapy, given asymptomatic agreement to hold for now and await Derm evaluation    Relevant Orders   Ambulatory referral to Dermatology   Eczema of left hand     - Likely secondary to chronic overuse Left hand with exposures at horse farm, wearing riding gloves, likely chronic dry skin with persistent friction - May continue topical moisturizers - Offered empiric topical steroid, patient will wait to see Dermatologist, advised likely will need lifestyle changes to avoid exacerbating factors    Relevant Orders   Ambulatory referral to Dermatology      No orders of the defined types were placed in this encounter.   Follow up plan: Return in about 2 months (around 01/09/2017) for Annual Physical.  Saralyn PilarAlexander Tyerra Loretto, DO [facility] 11/11/2016, 4:34 PM

## 2016-11-11 NOTE — Assessment & Plan Note (Addendum)
Stable, chronic problem with psoriasis mostly scalp dermatitis - Has only been on topical OTC acne / skin cleansing treatments - No other topical or other therapies used  Plan: 1. [facility] to establish for chronic management of Psoriasis, among other more recent Derm concerns today. Will defer starting new topical treatments for this today

## 2016-11-11 NOTE — Patient Instructions (Addendum)
Thank you for coming in to clinic today.  1. Referral to Dermatology for all 3 of these issues today  [facility] - Dr. Jesusita OkaAna Benitez-Graham   [address], [place], [place] [address] Phone: [phone]  I do not have a clear diagnosis for the raised rash on your neck - The thicker dry skin on left hand, very well could be a type of eczema vs psoriasis, from chronic wear maybe abrasion with gloves  Please schedule a follow-up appointment with Dr. Althea CharonKaramalegos / new NP Leotis ShamesLauren anytime in 4-6 weeks as needed for rash  If you have any other questions or concerns, please feel free to call the clinic or send a message through MyChart. You may also schedule an earlier appointment if necessary.  Saralyn PilarAlexander Sevin Langenbach, DO [facility]

## 2017-01-13 ENCOUNTER — Other Ambulatory Visit: Payer: Self-pay | Admitting: Family Medicine

## 2017-02-18 ENCOUNTER — Encounter: Payer: Self-pay | Admitting: Family Medicine

## 2017-02-18 ENCOUNTER — Other Ambulatory Visit: Payer: Self-pay

## 2017-02-18 DIAGNOSIS — E785 Hyperlipidemia, unspecified: Secondary | ICD-10-CM

## 2017-02-18 MED ORDER — ATORVASTATIN CALCIUM 20 MG PO TABS: 20.0000 mg | ORAL_TABLET | Freq: Every day | ORAL | 3 refills | Status: DC

## 2017-02-18 NOTE — Telephone Encounter (Signed)
Last ov 11/11/16  Last filled 11/05/15 Please review. Thank you. sd

## 2017-07-07 ENCOUNTER — Encounter: Payer: Self-pay | Admitting: Family Medicine

## 2017-07-07 ENCOUNTER — Ambulatory Visit (INDEPENDENT_AMBULATORY_CARE_PROVIDER_SITE_OTHER): Payer: 59 | Admitting: Family Medicine

## 2017-07-07 VITALS — BP 164/77 | HR 96 | Temp 98.3°F | Resp 16 | Ht 67.0 in | Wt 198.0 lb

## 2017-07-07 DIAGNOSIS — F329 Major depressive disorder, single episode, unspecified: Secondary | ICD-10-CM | POA: Diagnosis not present

## 2017-07-07 DIAGNOSIS — J440 Chronic obstructive pulmonary disease with acute lower respiratory infection: Secondary | ICD-10-CM | POA: Diagnosis not present

## 2017-07-07 DIAGNOSIS — J449 Chronic obstructive pulmonary disease, unspecified: Secondary | ICD-10-CM | POA: Diagnosis not present

## 2017-07-07 DIAGNOSIS — F32A Depression, unspecified: Secondary | ICD-10-CM

## 2017-07-07 DIAGNOSIS — J4520 Mild intermittent asthma, uncomplicated: Secondary | ICD-10-CM | POA: Diagnosis not present

## 2017-07-07 DIAGNOSIS — J209 Acute bronchitis, unspecified: Secondary | ICD-10-CM

## 2017-07-07 DIAGNOSIS — Z720 Tobacco use: Secondary | ICD-10-CM

## 2017-07-07 MED ORDER — ALBUTEROL SULFATE HFA 108 (90 BASE) MCG/ACT IN AERS: 2.0000 | INHALATION_SPRAY | Freq: Four times a day (QID) | RESPIRATORY_TRACT | 5 refills | Status: DC | PRN

## 2017-07-07 MED ORDER — PREDNISONE 50 MG PO TABS: 50.0000 mg | ORAL_TABLET | Freq: Every day | ORAL | 0 refills | Status: DC

## 2017-07-07 MED ORDER — FLUTICASONE-SALMETEROL 250-50 MCG/DOSE IN AEPB: 1.0000 | INHALATION_SPRAY | Freq: Two times a day (BID) | RESPIRATORY_TRACT | 5 refills | Status: DC

## 2017-07-07 MED ORDER — LEVOFLOXACIN 500 MG PO TABS
500.0000 mg | ORAL_TABLET | Freq: Every day | ORAL | 0 refills | Status: DC
Start: 2017-07-07 — End: 2018-12-12

## 2017-07-07 NOTE — Patient Instructions (Addendum)
Thank you for coming to the clinic today.  1. It sounds like you had an Upper Respiratory Virus that has settled into a Bronchitis, lower respiratory tract infection. I don't have concerns for pneumonia today, and think that this should gradually improve. Once you are feeling better, the cough may take a few weeks to fully resolve. I do hear wheezing and coarse breath sounds, this may asthma or COPD  Concern new dx COPD we need lung testing from pulmonologist next  Start Levaquin antibiotic  daily for 7 days  - Start Prednisone  daily for next 5 days - this will open up lungs allow you to breath better and treat that wheezing or bronchospasm  - Use Albuterol inhaler 2 puffs every 4-6 hours around the clock for next 2-3 days, max up to 5 days then use as needed  Start Advair inhaler twice daily for long term  Finish Mucinex  - Use nasal saline (Simply Saline or Ocean Spray) to flush nasal congestion multiple times a day, may help cough  - Drink plenty of fluids to improve congestion  If your symptoms seem to worsen instead of improve over next several days, including significant fever / chills, worsening shortness of breath, worsening wheezing, or nausea / vomiting and can't take medicines - return sooner or go to hospital Emergency Department for more immediate treatment.  DUE for FASTING BLOOD WORK (no food or drink after midnight before the lab appointment, only water or coffee without cream/sugar on the morning of)  SCHEDULE "Lab Only" visit in the morning at the clinic for lab draw in 3-4 WEEKS  - Make sure Lab Only appointment is at about 1 week before your next appointment, so that results will be available  For Lab Results, once available within 2-3 days of blood draw, you can can log in to MyChart online to view your results and a brief explanation. Also, we can discuss results at next follow-up visit.  Please schedule a Follow-up Appointment to: Return in about 4 weeks  (around 08/04/2017) for Annual Physical (?refer pulm PFT).  If you have any other questions or concerns, please feel free to call the clinic or send a message through MyChart. You may also schedule an earlier appointment if necessary.  Additionally, you may be receiving a survey about your experience at our clinic within a few days to 1 week by e-mail or mail. We value your feedback.  Saralyn Pilar, DO [facility], [place]

## 2017-07-07 NOTE — Progress Notes (Signed)
Subjective:    Patient ID: Jacqueline [name], female    DOB: 01-May-1961, 56 y.o.   MRN: [id number]  Jacqueline [name] is a 56 y.o. female presenting on 07/07/2017 for COPD (cough greenish mucus onset week chills, not sure about fever, OTC Mucinex D and theraflu capsule were taken not improving her sx)  Patient presents for a same day appointment.  HPI   PRESUMED COPD EXAC Reports symptoms started 1 week ago woke up with sore throat and coughing, itchy ear, limited improvement, tried Mucinex-D and Theraflu, improve for 1 day, then significant worsening. Significant thick green phlegm productive cough. - Don't normall get head colds - Flares - 2-3x day with improvement - Sick contact 1 [place] - Previously used to be on Advair in past, would like to restart - Never seen Pulmonology. Never had PFTs. No known formal dx COPD but states she was told this before - Admits chills, sweats. Admits some wheezing some chest tightness short of breath - Denies any recorded fever, nausea vomiting, headache, hemoptysis, chest pain or pressure  Active smoking - 1ppd, started approx 22 - approx 34 years ago Tried Chantix in past, quit for temporarily, tried initial starter pack and then did not get extended pack  PMH - Depression, previously while living in [place], on SSRI in past Citalopram for few years helped initially then less improvement. Overall has improved and now denies any depression, no longer taking SSRI. Has some history of occasional insomnia  Health Maintenance: - Due for Flu Shot, defer today since patient ill - Received pneumonia shot few years ago with improvement - Declined routine HIV / Hep C screening  Social History  Substance Use Topics  . Smoking status: Current Every Day Smoker    Packs/day: 1.00    Years: 34.00    Types: Cigarettes  . Smokeless tobacco: Current User     Comment: Failed Chantix in past  . Alcohol use 1.2 oz/week    2  Glasses of wine per week     Comment: glass of wine once a month    Review of Systems Per HPI unless specifically indicated above     Objective:    BP (!) 164/77   Pulse 96   Temp 98.3 F (36.8 C) (Oral)   Resp 16   Ht  (1.702 m)   Wt 198 lb (89.8 kg)   SpO2 98%   BMI 31.01 kg/m   Wt Readings from Last 3 Encounters:  07/07/17 198 lb (89.8 kg)  11/11/16 194 lb (88 kg)  03/03/16 193 lb (87.5 kg)    Physical Exam  Constitutional: She is oriented to person, place, and time. She appears well-developed and well-nourished. No distress.  Mildly ill appearing and tired, comfortable, cooperative  HENT:  Head: Normocephalic and atraumatic.  Mouth/Throat: Oropharynx is clear and moist.  Frontal / maxillary sinuses non-tender. Nares patent without purulence or edema. Bilateral TMs clear without erythema, effusion or bulging. Oropharynx clear without erythema, exudates, edema or asymmetry.  Eyes: Conjunctivae are normal. Right eye exhibits no discharge. Left eye exhibits no discharge.  Neck: Normal range of motion.  Cardiovascular: Normal rate, regular rhythm, normal heart sounds and intact distal pulses.   No murmur heard. Pulmonary/Chest: Effort normal. No respiratory distress. She has wheezes. She has no rales.  Reduced air movement. Exp wheezing scattered, coarse breath sounds. Coughing spells  Musculoskeletal: She exhibits no edema.  Neurological: She is alert and oriented to  person, place, and time.  Skin: Skin is warm and dry. No rash noted. She is not diaphoretic. No erythema.  Psychiatric: Her behavior is normal.  Nursing note and vitals reviewed.  Results for orders placed or performed in visit on 10/31/15  Lipid Profile  Result Value Ref Range   Cholesterol, Total 200 (H) 100 - 199 mg/dL   Triglycerides 161 (H) 0 - 149 mg/dL   HDL 38 (L) >09 mg/dL   VLDL Cholesterol Cal 57 (H) 5 - 40 mg/dL   LDL Calculated 604 (H) 0 - 99 mg/dL   Chol/HDL Ratio 5.3 (H) 0.0 - 4.4  ratio units  Comprehensive Metabolic Panel (CMET)  Result Value Ref Range   Glucose 92 65 - 99 mg/dL   BUN 17 6 - 24 mg/dL   Creatinine, Ser 5.40 0.57 - 1.00 mg/dL   GFR calc non Af Amer 91 >59 mL/min/1.73   GFR calc Af Amer 105 >59 mL/min/1.73   BUN/Creatinine Ratio 23 9 - 23   Sodium 144 134 - 144 mmol/L   Potassium 3.9 3.5 - 5.2 mmol/L   Chloride 102 96 - 106 mmol/L   CO2 25 18 - 29 mmol/L   Calcium 9.8 8.7 - 10.2 mg/dL   Total Protein 6.6 6.0 - 8.5 g/dL   Albumin 4.2 3.5 - 5.5 g/dL   Globulin, Total 2.4 1.5 - 4.5 g/dL   Albumin/Globulin Ratio 1.8 1.1 - 2.5   Bilirubin Total 0.3 0.0 - 1.2 mg/dL   Alkaline Phosphatase 93 39 - 117 IU/L   AST 22 0 - 40 IU/L   ALT 28 0 - 32 IU/L  VITAMIN D 25 Hydroxy (Vit-D Deficiency, Fractures)  Result Value Ref Range   Vit D, 25-Hydroxy 23.3 (L) 30.0 - 100.0 ng/mL  TSH  Result Value Ref Range   TSH 2.520 0.450 - 4.500 uIU/mL  CBC with Differential/Platelet  Result Value Ref Range   WBC 13.1 (H) 3.4 - 10.8 x10E3/uL   RBC 4.11 3.77 - 5.28 x10E6/uL   Hemoglobin 12.1 11.1 - 15.9 g/dL   Hematocrit 98.1 19.1 - 46.6 %   MCV 88 79 - 97 fL   MCH 29.4 26.6 - 33.0 pg   MCHC 33.6 31.5 - 35.7 g/dL   RDW 47.8 29.5 - 62.1 %   Platelets 276 150 - 379 x10E3/uL   Neutrophils 56 %   Lymphs 34 %   Monocytes 6 %   Eos 3 %   Basos 1 %   Neutrophils Absolute 7.4 (H) 1.4 - 7.0 x10E3/uL   Lymphocytes Absolute 4.4 (H) 0.7 - 3.1 x10E3/uL   Monocytes Absolute 0.8 0.1 - 0.9 x10E3/uL   EOS (ABSOLUTE) 0.3 0.0 - 0.4 x10E3/uL   Basophils Absolute 0.1 0.0 - 0.2 x10E3/uL   Immature Granulocytes 0 %   Immature Grans (Abs) 0.0 0.0 - 0.1 x10E3/uL      Assessment & Plan:   Problem List Items Addressed This Visit    Chronic obstructive pulmonary disease (HCC) - Primary   Relevant Medications   albuterol (PROVENTIL HFA;VENTOLIN HFA) 108 (90 Base) MCG/ACT inhaler   Fluticasone-Salmeterol (ADVAIR DISKUS) 250-50 MCG/DOSE AEPB   predniSONE (DELTASONE) 50 MG tablet     RESOLVED: Depression    Not focus of visit today, but reports history of depression in past, since seems to be resolved Now has occasional insomnia otherwise no longer taking SSRI Citalopram      Tobacco abuse  Active smoker, up to 1ppd for >34 years - Previously quit  attempts with Chantix and Wellbutrin helped but unable to remain smoke free - Interested in quitting in future, admits not ready at this time. Would re-consider chantix     Other Visit Diagnoses    Mild intermittent asthma, uncomplicated       Relevant Medications   albuterol (PROVENTIL HFA;VENTOLIN HFA) 108 (90 Base) MCG/ACT inhaler   Fluticasone-Salmeterol (ADVAIR DISKUS) 250-50 MCG/DOSE AEPB   predniSONE (DELTASONE) 50 MG tablet   Acute bronchitis with COPD (HCC)       Relevant Medications   albuterol (PROVENTIL HFA;VENTOLIN HFA) 108 (90 Base) MCG/ACT inhaler   Fluticasone-Salmeterol (ADVAIR DISKUS) 250-50 MCG/DOSE AEPB   levofloxacin (LEVAQUIN) 500 MG tablet   predniSONE (DELTASONE) 50 MG tablet  Clinically consistent with acute COPD exacerbation based on exam and presentation with recent URI vs bronchitis now worsening productive cough / dyspnea and wheezing. No formal dx of COPD before, but with long smoking history and as active smoker it is supportive of dx. - Improved on Albuterol  Plan: 1. Offered Duoneb in office, patient declined 2. Start Prednisone burst  daily x 5 days 3. Start Levaquin antibiotic  daily x 7 days 4. Refilled Albuterol inhaler 2 puffs q4-6 hour PRN use regularly for few days then PRN 5. Restart Advair 1 puff BID - refills, given, maintenance inhaler - has been off this for >1 year 6. Return criteria given if not improved on treatment or other significant worsening 7. Follow-up within 3-4 weeks for an annual physical / labs and re-evaluation breathing COPD, she will need flu shot as well at that time. Recommended future PFTs and possibly Pulmnology referral, and patient is  in agreement, will likely refer at that time for both.      Meds ordered this encounter  Medications  . albuterol (PROVENTIL HFA;VENTOLIN HFA) 108 (90 Base) MCG/ACT inhaler    Sig: Inhale 2 puffs into the lungs every 6 (six) hours as needed for wheezing or shortness of breath.    Dispense:  1 Inhaler    Refill:  5  . Fluticasone-Salmeterol (ADVAIR DISKUS) 250-50 MCG/DOSE AEPB    Sig: Inhale 1 puff into the lungs 2 (two) times daily.    Dispense:  60 each    Refill:  5  . levofloxacin (LEVAQUIN) 500 MG tablet    Sig: Take 1 tablet (500 mg total) by mouth daily. For 7 days    Dispense:  7 tablet    Refill:  0  . predniSONE (DELTASONE) 50 MG tablet    Sig: Take 1 tablet (50 mg total) by mouth daily with breakfast.    Dispense:  5 tablet    Refill:  0    Follow up plan: Return in about 4 weeks (around 08/04/2017) for Annual Physical (?refer pulm PFT).  Future labs ordered.  Saralyn Pilar, DO [facility] 07/08/2017, 10:27 PM

## 2017-07-08 ENCOUNTER — Encounter: Payer: Self-pay | Admitting: Family Medicine

## 2017-07-08 ENCOUNTER — Other Ambulatory Visit: Payer: Self-pay | Admitting: Family Medicine

## 2017-07-08 DIAGNOSIS — J449 Chronic obstructive pulmonary disease, unspecified: Secondary | ICD-10-CM | POA: Insufficient documentation

## 2017-07-08 DIAGNOSIS — R799 Abnormal finding of blood chemistry, unspecified: Secondary | ICD-10-CM

## 2017-07-08 DIAGNOSIS — E559 Vitamin D deficiency, unspecified: Secondary | ICD-10-CM

## 2017-07-08 DIAGNOSIS — Z0000 Encounter for general adult medical examination without abnormal findings: Secondary | ICD-10-CM

## 2017-07-08 DIAGNOSIS — R7989 Other specified abnormal findings of blood chemistry: Secondary | ICD-10-CM

## 2017-07-08 DIAGNOSIS — E782 Mixed hyperlipidemia: Secondary | ICD-10-CM

## 2017-07-08 NOTE — Assessment & Plan Note (Signed)
Not focus of visit today, but reports history of depression in past, since seems to be resolved Now has occasional insomnia otherwise no longer taking SSRI Citalopram

## 2017-08-05 ENCOUNTER — Encounter: Payer: 59 | Admitting: Family Medicine

## 2017-11-03 ENCOUNTER — Other Ambulatory Visit: Payer: Self-pay

## 2017-11-03 DIAGNOSIS — J209 Acute bronchitis, unspecified: Secondary | ICD-10-CM

## 2017-11-03 DIAGNOSIS — J440 Chronic obstructive pulmonary disease with acute lower respiratory infection: Secondary | ICD-10-CM

## 2017-11-03 DIAGNOSIS — J449 Chronic obstructive pulmonary disease, unspecified: Secondary | ICD-10-CM

## 2017-11-03 DIAGNOSIS — E785 Hyperlipidemia, unspecified: Secondary | ICD-10-CM

## 2017-11-03 MED ORDER — FLUTICASONE-SALMETEROL 250-50 MCG/DOSE IN AEPB: 1.0000 | INHALATION_SPRAY | Freq: Two times a day (BID) | RESPIRATORY_TRACT | 3 refills | Status: DC

## 2018-12-12 ENCOUNTER — Other Ambulatory Visit: Payer: Self-pay | Admitting: Family Medicine

## 2018-12-12 ENCOUNTER — Ambulatory Visit (INDEPENDENT_AMBULATORY_CARE_PROVIDER_SITE_OTHER): Payer: 59 | Admitting: Family Medicine

## 2018-12-12 ENCOUNTER — Encounter: Payer: Self-pay | Admitting: Family Medicine

## 2018-12-12 ENCOUNTER — Other Ambulatory Visit: Payer: Self-pay

## 2018-12-12 VITALS — BP 160/84 | HR 74 | Temp 98.2°F | Resp 16 | Ht 67.0 in | Wt 202.6 lb

## 2018-12-12 DIAGNOSIS — N649 Disorder of breast, unspecified: Secondary | ICD-10-CM

## 2018-12-12 DIAGNOSIS — N6452 Nipple discharge: Secondary | ICD-10-CM

## 2018-12-12 DIAGNOSIS — R030 Elevated blood-pressure reading, without diagnosis of hypertension: Secondary | ICD-10-CM | POA: Insufficient documentation

## 2018-12-12 DIAGNOSIS — J432 Centrilobular emphysema: Secondary | ICD-10-CM

## 2018-12-12 DIAGNOSIS — F331 Major depressive disorder, recurrent, moderate: Secondary | ICD-10-CM | POA: Diagnosis not present

## 2018-12-12 DIAGNOSIS — E782 Mixed hyperlipidemia: Secondary | ICD-10-CM

## 2018-12-12 DIAGNOSIS — Z0000 Encounter for general adult medical examination without abnormal findings: Secondary | ICD-10-CM

## 2018-12-12 DIAGNOSIS — Z720 Tobacco use: Secondary | ICD-10-CM

## 2018-12-12 DIAGNOSIS — J441 Chronic obstructive pulmonary disease with (acute) exacerbation: Secondary | ICD-10-CM

## 2018-12-12 DIAGNOSIS — L988 Other specified disorders of the skin and subcutaneous tissue: Secondary | ICD-10-CM

## 2018-12-12 DIAGNOSIS — E538 Deficiency of other specified B group vitamins: Secondary | ICD-10-CM

## 2018-12-12 DIAGNOSIS — E559 Vitamin D deficiency, unspecified: Secondary | ICD-10-CM

## 2018-12-12 DIAGNOSIS — F3341 Major depressive disorder, recurrent, in partial remission: Secondary | ICD-10-CM | POA: Insufficient documentation

## 2018-12-12 MED ORDER — ALBUTEROL SULFATE HFA 108 (90 BASE) MCG/ACT IN AERS: 2.0000 | INHALATION_SPRAY | Freq: Four times a day (QID) | RESPIRATORY_TRACT | 5 refills | Status: DC | PRN

## 2018-12-12 MED ORDER — PREDNISONE 50 MG PO TABS: 50.0000 mg | ORAL_TABLET | Freq: Every day | ORAL | 0 refills | Status: DC

## 2018-12-12 MED ORDER — LEVOFLOXACIN 500 MG PO TABS: 500.0000 mg | ORAL_TABLET | Freq: Every day | ORAL | 0 refills | Status: DC

## 2018-12-12 NOTE — Assessment & Plan Note (Addendum)
Clinically with emphysema, mild - now with acute exacerbation Likely some mixed picture with asthma as well See A&P AECOPD  Future consider re order Advair, vs possible alternative inhaler such as Anoro if indicated.  Smoking cessation May refer to Pulmonology for better characterization

## 2018-12-12 NOTE — Assessment & Plan Note (Signed)
Active smoker 0.75 up to 1.5 ppd Failed chantix Not ready for additional smoking cessation now - but admits she would return when ready

## 2018-12-12 NOTE — Progress Notes (Signed)
Subjective:    Patient ID: Jacqueline [name], female    DOB: 07/17/1961, 57 y.o.   MRN: [id number]  Jacqueline [name] is a 58 y.o. female presenting on 12/12/2018 for COPD and Depression  Note - patient was scheduled for an Annual Physical today, however due to >1.5 years since last visit, multiple more acute complaints today and due for blood work, she will re-schedule for Annual Physical and instead today focus on medical problems.   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 - Now currently she reports worsening within past several days with productive cough, wheezing, and dyspnea increased. She feels well otherwise, primarily concern with her breathing, triggered worse by outdoor exposure and smoking - using advair, declines re order - her albuterol inhaler has run out, needs re order - Denies any fevers, chills, sweats, sick contacts, travel to high risk areas of concern for COVID19  Recurrent depression, moderate Chronic history of recurrent episodes of depression, seems to occur based on life stressors affecting her mood, in past was on SSRI did well before for years, then came off, and now more recent history self tapered down and ran out of rx, last dose 8 months ago, she did well on it but prefers not to take long term medication, she is not ready to restart at this time, but would reconsider - She wants to check blood work for upcoming physical, she feels some of her reduced energy among other symptoms is related to hormonal or other medical causes as well and wants to rule this out first before restarting medication  Right Breast  Problem (skin inflammation with discharge vs R nipple discharge) Previous history of Diagnostic mammogram 11/2015 and ultrasound due to some brown discharge over period of time and palpable lump felt R outer breast, ultimately imaging and US showed multiple breast cysts, benign - She did not return for mammogram in 2018 or 2019 - Today now request repeat mammogram, she is not worried about it, but states that she does have some breast symptoms and wanted to them checked out, now describes a "discharge" she believes from nipple region inferiorly questionable if coming from nipple or not, does not endorse pain or redness or swelling or lump or mass  Elevated BP without dx of HTN Reports history of occasional elevated BP, usually if sick or other issues. Not checking BP regularly Current Meds - None   Denies CP, dyspnea, HA, edema, dizziness / lightheadedness   Health Maintenance:  Due for Mammogram, see above. Breast complaint.  Due for colon screening - will discuss at now re-scheduled annual visit.   Depression screen [facility] 2/9 12/12/2018 07/08/2017 07/07/2017  Decreased Interest 1 0 0  Down, Depressed, Hopeless 0 0 0  PHQ - 2 Score 1 0 0  Altered sleeping 2 0 -  Tired, decreased energy 3 0 -  Change in appetite 1 0 -  Feeling bad or failure about yourself  0 0 -  Trouble concentrating 1 0 -  Moving slowly or fidgety/restless 0 0 -  Suicidal thoughts 0 0 -  PHQ-9 Score 8 0 -  Difficult doing work/chores Not difficult at  all Not difficult at all -   GAD 7 : Generalized Anxiety Score 12/12/2018  Nervous, Anxious, on Edge 0  Control/stop worrying 0  Worry too much - different things 0  Trouble relaxing 0  Restless 0  Easily annoyed or irritable 3  Afraid - awful might happen 0  Total GAD 7 Score 3  Anxiety Difficulty Not difficult at all    Past Medical History:  Diagnosis Date  . Depression    Past Surgical History:  Procedure Laterality Date  . ABDOMINAL HYSTERECTOMY  2000   . APPENDECTOMY  1981  . TONSILLECTOMY     58 years old   Social History   Socioeconomic History  . Marital status: Divorced    Spouse name: Not on file  . Number of children: Not on file  . Years of education: Not on file  . Highest education level: Not on file  Occupational History  . Not on file  Social Needs  . Financial resource strain: Not on file  . Food insecurity:    Worry: Not on file    Inability: Not on file  . Transportation needs:    Medical: Not on file    Non-medical: Not on file  Tobacco Use  . Smoking status: Current Every Day Smoker    Packs/day: 1.00    Years: 34.00    Pack years: 34.00    Types: Cigarettes  . Smokeless tobacco: Current User  . Tobacco comment: Failed Chantix in past  Substance and Sexual Activity  . Alcohol use: Yes    Alcohol/week: 2.0 standard drinks    Types: 2 Glasses of wine per week    Comment: glass of wine once a month  . Drug use: No  . Sexual activity: Yes  Lifestyle  . Physical activity:    Days per week: Not on file    Minutes per session: Not on file  . Stress: Not on file  Relationships  . Social connections:    Talks on phone: Not on file    Gets together: Not on file    Attends religious service: Not on file    Active member of club or organization: Not on file    Attends meetings of clubs or organizations: Not on file    Relationship status: Not on file  . Intimate partner violence:    Fear of current or ex partner: Not on file    Emotionally abused: Not on file    Physically abused: Not on file    Forced sexual activity: Not on file  Other Topics Concern  . Not on file  Social History Narrative  . Not on file   Family History  Adopted: Yes   Current Outpatient Medications on File Prior to Visit  Medication Sig  . cetirizine (ZYRTEC) 10 MG tablet Take 10 mg by mouth daily.  . Fluticasone-Salmeterol (ADVAIR DISKUS) 250-50 MCG/DOSE AEPB Inhale 1 puff into the lungs 2 (two) times daily.  . Multiple  Vitamin (MULTIVITAMIN) tablet Take 1 tablet by mouth daily.  Marland Kitchen omeprazole (PRILOSEC) 10 MG capsule Take 10 mg by mouth daily.   No current facility-administered medications on file prior to visit.     Review of Systems Per HPI unless specifically indicated above      Objective:    BP (!) 160/84 (BP Location: Left Arm, Cuff Size: Normal)   Pulse 74   Temp 98.2 F (36.8 C) (Oral)   Resp 16   Ht  (1.702 m)  Wt 202 lb 9.6 oz (91.9 kg)   BMI 31.73 kg/m   Wt Readings from Last 3 Encounters:  12/12/18 202 lb 9.6 oz (91.9 kg)  07/07/17 198 lb (89.8 kg)  11/11/16 194 lb (88 kg)    Physical Exam Vitals signs and nursing note reviewed.  Constitutional:      General: She is not in acute distress.    Appearance: She is well-developed. She is not diaphoretic.     Comments: Well-appearing, comfortable, cooperative, obese  HENT:     Head: Normocephalic and atraumatic.     Comments: Frontal / maxillary sinuses non-tender. Nares patent without congestion  Bilateral TMs clear with fullness and mild effusion bilateral without erythema or bulging Oropharynx clear without erythema, exudates, edema or asymmetry. Eyes:     General:        Right eye: No discharge.        Left eye: No discharge.     Conjunctiva/sclera: Conjunctivae normal.  Neck:     Musculoskeletal: Normal range of motion and neck supple.     Thyroid: No thyromegaly.  Cardiovascular:     Rate and Rhythm: Normal rate and regular rhythm.     Heart sounds: Normal heart sounds. No murmur.  Pulmonary:     Effort: Pulmonary effort is normal. No respiratory distress.     Breath sounds: Wheezing (diffuse end expiratory wheezing) present. No rales.     Comments: Reduced air movement. Occasional coughing spells. Speaks full sentences. Musculoskeletal: Normal range of motion.  Lymphadenopathy:     Cervical: No cervical adenopathy.  Skin:    General: Skin is warm and dry.     Findings: No erythema or rash.  Neurological:      Mental Status: She is alert and oriented to person, place, and time.  Psychiatric:        Behavior: Behavior normal.     Comments: Well groomed, good eye contact, normal speech and thoughts    Results for orders placed or performed in visit on 10/31/15  Lipid Profile  Result Value Ref Range   Cholesterol, Total 200 (H) 100 - 199 mg/dL   Triglycerides 161 (H) 0 - 149 mg/dL   HDL 38 (L) >09 mg/dL   VLDL Cholesterol Cal 57 (H) 5 - 40 mg/dL   LDL Calculated 604 (H) 0 - 99 mg/dL   Chol/HDL Ratio 5.3 (H) 0.0 - 4.4 ratio units  Comprehensive Metabolic Panel (CMET)  Result Value Ref Range   Glucose 92 65 - 99 mg/dL   BUN 17 6 - 24 mg/dL   Creatinine, Ser 5.40 0.57 - 1.00 mg/dL   GFR calc non Af Amer 91 >59 mL/min/1.73   GFR calc Af Amer 105 >59 mL/min/1.73   BUN/Creatinine Ratio 23 9 - 23   Sodium 144 134 - 144 mmol/L   Potassium 3.9 3.5 - 5.2 mmol/L   Chloride 102 96 - 106 mmol/L   CO2 25 18 - 29 mmol/L   Calcium 9.8 8.7 - 10.2 mg/dL   Total Protein 6.6 6.0 - 8.5 g/dL   Albumin 4.2 3.5 - 5.5 g/dL   Globulin, Total 2.4 1.5 - 4.5 g/dL   Albumin/Globulin Ratio 1.8 1.1 - 2.5   Bilirubin Total 0.3 0.0 - 1.2 mg/dL   Alkaline Phosphatase 93 39 - 117 IU/L   AST 22 0 - 40 IU/L   ALT 28 0 - 32 IU/L  VITAMIN D 25 Hydroxy (Vit-D Deficiency, Fractures)  Result Value Ref Range   Vit D,  25-Hydroxy 23.3 (L) 30.0 - 100.0 ng/mL  TSH  Result Value Ref Range   TSH 2.520 0.450 - 4.500 uIU/mL  CBC with Differential/Platelet  Result Value Ref Range   WBC 13.1 (H) 3.4 - 10.8 x10E3/uL   RBC 4.11 3.77 - 5.28 x10E6/uL   Hemoglobin 12.1 11.1 - 15.9 g/dL   Hematocrit 29.9 37.1 - 46.6 %   MCV 88 79 - 97 fL   MCH 29.4 26.6 - 33.0 pg   MCHC 33.6 31.5 - 35.7 g/dL   RDW 69.6 78.9 - 38.1 %   Platelets 276 150 - 379 x10E3/uL   Neutrophils 56 %   Lymphs 34 %   Monocytes 6 %   Eos 3 %   Basos 1 %   Neutrophils Absolute 7.4 (H) 1.4 - 7.0 x10E3/uL   Lymphocytes Absolute 4.4 (H) 0.7 - 3.1 x10E3/uL    Monocytes Absolute 0.8 0.1 - 0.9 x10E3/uL   EOS (ABSOLUTE) 0.3 0.0 - 0.4 x10E3/uL   Basophils Absolute 0.1 0.0 - 0.2 x10E3/uL   Immature Granulocytes 0 %   Immature Grans (Abs) 0.0 0.0 - 0.1 x10E3/uL      Assessment & Plan:   Problem List Items Addressed This Visit    Centrilobular emphysema (HCC)    Clinically with emphysema, mild - now with acute exacerbation Likely some mixed picture with asthma as well See A&P AECOPD  Future consider re order Advair, vs possible alternative inhaler such as Anoro if indicated.  Smoking cessation May refer to Pulmonology for better characterization      Relevant Medications   albuterol (PROVENTIL HFA;VENTOLIN HFA) 108 (90 Base) MCG/ACT inhaler   predniSONE (DELTASONE) 50 MG tablet   Elevated BP without diagnosis of hypertension    Elevated today, gradual improve on manual re-check With AECOPD among other symptoms  Advised to check BP outside office Smoking cessation Follow-up as scheduled w/ labs 3-4 weeks      Moderate recurrent major depression (HCC)    Clinically with some moderate depressive symptoms, seems episode, currently relatively stable off medication SSRI Citalopram - Previously worse with life stressors among other factors  Plan - Hold restart or new medication for mood at this time - Check routine labs and screening lab as ordered within 3-4 weeks - consider if any medical cause of her symptoms, otherwise, if unremarkable source of fatigue and reduced energy then would reconsider SSRI or other options  Also - some fatigue and symptoms, seems to be related to postmenopausal symptoms, she is s/p hysterectomy surgical menopause >10+ Year      Tobacco abuse    Active smoker 0.75 up to 1.5 ppd Failed chantix Not ready for additional smoking cessation now - but admits she would return when ready       Other Visit Diagnoses    Lesion of skin of breast    -  Primary   Relevant Orders   US BREAST LTD UNI RIGHT INC AXILLA    MM DIAG BREAST TOMO BILATERAL   Discharge from right nipple       Relevant Orders   US BREAST LTD UNI RIGHT INC AXILLA   MM DIAG BREAST TOMO BILATERAL  Clinically with abnormal breast complaint R sided, seems to be drainage vs nipple discharge again, similar issue in past, had diagnostic mammo [place] 3 years ago, result reviewed, with cystic findings.  Patient did not request breast exam today. Requested order for new mammogram, she wishes to return to [facility] - orders placed, advised  her to call to schedule diagnostic mammo / [place]    COPD with acute exacerbation (HCC)       Relevant Medications   albuterol (PROVENTIL HFA;VENTOLIN HFA) 108 (90 Base) MCG/ACT inhaler   levofloxacin (LEVAQUIN) 500 MG tablet   predniSONE (DELTASONE) 50 MG tablet      Consistent with mild early acute exacerbation of COPD with worsening productive cough. Similar to prior exacerbations, last 06/2017. - Afebrile currently, without respiratory distress. Signficant wheezing on exam. 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 Start Prednisone  x 5 day steroid burst 2. Start taking Levaquin antibiotic  daily x 7 days 3. Re order albuterol -  Use albuterol q 4 hr regularly x 2-3 days. Continue maintenance inhalers - advair 4. Follow-up about 1 week if not improving, otherwise strict return criteria to go to ED    Meds ordered this encounter  Medications  . albuterol (PROVENTIL HFA;VENTOLIN HFA) 108 (90 Base) MCG/ACT inhaler    Sig: Inhale 2 puffs into the lungs every 6 (six) hours as needed for wheezing or shortness of breath.    Dispense:  1 Inhaler    Refill:  5  . levofloxacin (LEVAQUIN) 500 MG tablet    Sig: Take 1 tablet (500 mg total) by mouth daily. For 7 days    Dispense:  7 tablet    Refill:  0  . predniSONE (DELTASONE) 50 MG tablet    Sig: Take 1 tablet (50 mg total) by mouth daily with breakfast.     Dispense:  5 tablet    Refill:  0    Follow up plan: Return in about 3 weeks (around 01/02/2019) for Annual Physical.  Printed [facility] orders and given to patient today, to get drawn within 3-4 weeks, return for Annual Physical within 1 week of lab draw.  Saralyn Pilar, DO [facility] 12/12/2018, 2:29 PM

## 2018-12-12 NOTE — Assessment & Plan Note (Addendum)
Clinically with some moderate depressive symptoms, seems episode, currently relatively stable off medication SSRI Citalopram - Previously worse with life stressors among other factors  Plan - Hold restart or new medication for mood at this time - Check routine labs and screening lab as ordered within 3-4 weeks - consider if any medical cause of her symptoms, otherwise, if unremarkable source of fatigue and reduced energy then would reconsider SSRI or other options  Also - some fatigue and symptoms, seems to be related to postmenopausal symptoms, she is s/p hysterectomy surgical menopause >10+ Year

## 2018-12-12 NOTE — Patient Instructions (Addendum)
Thank you for coming to the office today.  Treating COPD flare up vs Asthma - Start taking Levaquin antibiotic 500mg  daily x 7 days - Start Prednisone 50mg  daily with food for 5 days - Refilled Albuterol - CONTINUE Advair for now - let me know if need more.  HOlding Citalopram for now  For Mammogram screening for breast cancer   Call the Imaging Center below anytime to schedule your own appointment now that order has been placed.  [facility] [address] [place], [place] [address] Phone: [phone]   DUE for \\FASTING  BLOOD WORK (no food or drink after midnight before the lab appointment, only water or coffee without cream/sugar on the morning of)  At ANY [facility] in 1-2 weeks - prefer to be done within 2 weeks of your upcoming PHYSICAL  For Lab Results, once available within 2-3 days of blood draw, you can can log in to MyChart online to view your results and a brief explanation. Also, we can discuss results at next follow-up visit.    Please schedule a Follow-up Appointment to: Return in about 3 weeks (around 01/02/2019) for Annual Physical.  If you have any other questions or concerns, please feel free to call the office or send a message through MyChart. You may also schedule an earlier appointment if necessary.  Additionally, you may be receiving a survey about your experience at our office within a few days to 1 week by e-mail or mail. We value your feedback.  Saralyn Pilar, DO [facility], [place]

## 2018-12-12 NOTE — Assessment & Plan Note (Addendum)
Elevated today, gradual improve on manual re-check With AECOPD among other symptoms  Advised to check BP outside office Smoking cessation Follow-up as scheduled w/ labs 3-4 weeks

## 2018-12-15 ENCOUNTER — Other Ambulatory Visit: Payer: 59

## 2018-12-18 ENCOUNTER — Telehealth: Payer: Self-pay | Admitting: Family Medicine

## 2018-12-18 DIAGNOSIS — J441 Chronic obstructive pulmonary disease with (acute) exacerbation: Secondary | ICD-10-CM

## 2018-12-18 MED ORDER — PREDNISONE 20 MG PO TABS: ORAL_TABLET | ORAL | 0 refills | Status: DC

## 2018-12-18 MED ORDER — AZITHROMYCIN 250 MG PO TABS: ORAL_TABLET | ORAL | 0 refills | Status: DC

## 2018-12-18 NOTE — Telephone Encounter (Signed)
Congestion ,shortness of breath ,no  Fever   Was given  Medication 3/17  With   K   [phone]

## 2018-12-18 NOTE — Telephone Encounter (Addendum)
Patient states that she had cold like Symptoms at her last visit and got treated with antibiotics and prednisone. She denies high risk travel or exposure to COVID 19. She had finished prednisone but still on Levaquin and denies fever but sinus congestion is turned into chest congestion, wheezing and cough, SOB is getting worst. The only place she travelled was [place], [place], and [employer]. Please suggest ?

## 2018-12-18 NOTE — Telephone Encounter (Addendum)
TELEPHONIC MEDICAL VISIT Location: [facility] Provider: Saralyn Pilar, DO ---------------------------------------------------------------------- CC: Cough, Shortness of breath  S: Reviewed CMA telephone note below. I have called patient and gathered additional HPI as follows:  Reports that symptoms started about 10 days prior - she was last seen by me in office visit on 12/12/18 for similar symptoms with suspected Acute COPD exacerbation / Sinusitis. At that time she was treated with Levaquin course 500mg  daily x 7 days and Prednisone burst 50mg  daily x 5 days, Albuterol, and Advair. - Tried OTC Mucinex  Now she has not improved on this above treatment, still seems persistent symptoms - has sinus and now more chest congestion, some difficulty breathing at time. Limited albuterol relief.  Admits prior travel history see nursing note below, but no other new or recent high risk travel to areas of current concern for COVID19. Denies any known or suspected exposure to person with or possibly with COVID19.  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  Admits productive cough and shortness of breath Denies any fevers, chills, sweats, body ache, sinus pain or pressure, headache, abdominal pain, diarrhea  -------------------------------------------------------------------------- O: No physical exam performed due to remote telephone encounter.  -------------------------------------------------------------------------- A&P:  Suspected Persistent COPD Exacerbation / Cannot rule out COVID19 - Initial failure of AECOPD treatment from 6 days ago, is concerning - did not resolve - still finishing levaquin, concern that breathing did not improve on prednisone. She was empirically covered for pneumonia/sinusitis as well. - Without fever currently - otherwise has cough / dyspnea - Cormorbid pulmonary condition with COPD  1. Discussion  on options - first - strictly advised that based on current guidelines, with her respiratory symptoms - she is asked to stay at home and manage symptomatically with current treatments if possible. If at any point - severe symptoms worsening - shortness of breath or emergent respiratory conditions then she should call 911 or seek care at hospital emergency dept - for possible more aggressive measures, again in the event that she possibly could have COVID19  Agree to switch antibiotic course to Azithromycin Z-pak dosing trial Add Prednisone taper this time for repeat course  [id number] - she lives in [place]. She was advised to send someone to pharmacy to pick up her meds.  No testing is recommended at this time in outpatient setting due to already existing community spread.  Self quarantine - advised to avoid all exposure with others while during treatment. Should continue to quarantine for up to 7-14 days, pending resolution of symptoms, if symptoms resolve by 7 days and is afebrile >48 hours - may STOP self quarantine at that time.   If symptoms do not resolve or improve - and still fever / cough - or worsening dyspnea - then should contact us and seek advice on possibly at [facility] ED for further intervention and testing if indicated.   Patient verbalizes understanding with the above medical recommendations including the limitation of remote medical advice.  Specific follow-up / return / call-back criteria were given for patient to follow-up or seek medical care more urgently if needed.  Disclosed to patient at start of encounter that we will bill telephone services and she will receive bill of services provided.  Patient consents to be treated via phone prior to discussion. - Patient is at home and is accessed via telephone. - Services are provided from [facility] - Time  spent in direct consultation with patient on phone: 13 minutes  Note - no  additional charge capture code used due to related E&M service provided within 7 days, on 12/12/18.  Saralyn Pilar, DO [facility] 12/18/2018, 11:59 AM

## 2018-12-22 ENCOUNTER — Ambulatory Visit
Admission: RE | Admit: 2018-12-22 | Discharge: 2018-12-22 | Disposition: A | Discharge status: Home / Self Care | Payer: 59 | Source: Ambulatory Visit | Attending: Family Medicine | Admitting: Family Medicine

## 2018-12-22 ENCOUNTER — Other Ambulatory Visit: Payer: Self-pay

## 2018-12-22 ENCOUNTER — Other Ambulatory Visit: Payer: Self-pay | Admitting: Family Medicine

## 2018-12-22 DIAGNOSIS — N6452 Nipple discharge: Secondary | ICD-10-CM

## 2018-12-22 DIAGNOSIS — L988 Other specified disorders of the skin and subcutaneous tissue: Secondary | ICD-10-CM

## 2018-12-22 DIAGNOSIS — N649 Disorder of breast, unspecified: Secondary | ICD-10-CM | POA: Insufficient documentation

## 2018-12-22 DIAGNOSIS — N6320 Unspecified lump in the left breast, unspecified quadrant: Secondary | ICD-10-CM | POA: Diagnosis present

## 2018-12-23 LAB — COMPREHENSIVE METABOLIC PANEL
ALBUMIN: 4.5 g/dL (ref 3.8–4.9)
ALK PHOS: 75 IU/L (ref 39–117)
ALT: 17 IU/L (ref 0–32)
AST: 14 IU/L (ref 0–40)
Albumin/Globulin Ratio: 2 (ref 1.2–2.2)
BUN / CREAT RATIO: 19 (ref 9–23)
BUN: 17 mg/dL (ref 6–24)
Bilirubin Total: 0.7 mg/dL (ref 0.0–1.2)
CO2: 25 mmol/L (ref 20–29)
CREATININE: 0.88 mg/dL (ref 0.57–1.00)
Calcium: 9.4 mg/dL (ref 8.7–10.2)
Chloride: 101 mmol/L (ref 96–106)
GFR calc Af Amer: 83 mL/min/{1.73_m2} (ref >59)
GFR calc non Af Amer: 72 mL/min/{1.73_m2} (ref >59)
GLUCOSE: 80 mg/dL (ref 65–99)
Globulin, Total: 2.2 g/dL (ref 1.5–4.5)
Potassium: 4.3 mmol/L (ref 3.5–5.2)
Sodium: 143 mmol/L (ref 134–144)
Total Protein: 6.7 g/dL (ref 6.0–8.5)

## 2018-12-23 LAB — CBC WITH DIFFERENTIAL/PLATELET
BASOS ABS: 0.1 10*3/uL (ref 0.0–0.2)
Basos: 1 %
EOS (ABSOLUTE): 0.3 10*3/uL (ref 0.0–0.4)
Eos: 2 %
Hematocrit: 41.5 % (ref 34.0–46.6)
Hemoglobin: 13.8 g/dL (ref 11.1–15.9)
IMMATURE GRANS (ABS): 0.1 10*3/uL (ref 0.0–0.1)
Immature Granulocytes: 1 %
LYMPHS: 45 %
Lymphocytes Absolute: 8 10*3/uL — ABNORMAL HIGH (ref 0.7–3.1)
MCH: 29.6 pg (ref 26.6–33.0)
MCHC: 33.3 g/dL (ref 31.5–35.7)
MCV: 89 fL (ref 79–97)
Monocytes Absolute: 0.8 10*3/uL (ref 0.1–0.9)
Monocytes: 4 %
NEUTROS ABS: 8.5 10*3/uL — AB (ref 1.4–7.0)
NEUTROS PCT: 47 %
PLATELETS: 343 10*3/uL (ref 150–450)
RBC: 4.67 x10E6/uL (ref 3.77–5.28)
RDW: 14 % (ref 11.7–15.4)
WBC: 17.9 10*3/uL — ABNORMAL HIGH (ref 3.4–10.8)

## 2018-12-23 LAB — HEMOGLOBIN A1C
Est. average glucose Bld gHb Est-mCnc: 117 mg/dL
Hgb A1c MFr Bld: 5.7 % — ABNORMAL HIGH (ref 4.8–5.6)

## 2018-12-23 LAB — LIPID PANEL
CHOL/HDL RATIO: 4 ratio (ref 0.0–4.4)
CHOLESTEROL TOTAL: 185 mg/dL (ref 100–199)
HDL: 46 mg/dL (ref >39)
LDL CALC: 69 mg/dL (ref 0–99)
Triglycerides: 350 mg/dL — ABNORMAL HIGH (ref 0–149)
VLDL Cholesterol Cal: 70 mg/dL — ABNORMAL HIGH (ref 5–40)

## 2018-12-23 LAB — VITAMIN B12: VITAMIN B 12: 1007 pg/mL (ref 232–1245)

## 2018-12-23 LAB — TSH: TSH: 2.02 u[IU]/mL (ref 0.450–4.500)

## 2018-12-23 LAB — T4, FREE: Free T4: 1.3 ng/dL (ref 0.82–1.77)

## 2018-12-23 LAB — VITAMIN D 25 HYDROXY (VIT D DEFICIENCY, FRACTURES): VIT D 25 HYDROXY: 27.3 ng/mL — AB (ref 30.0–100.0)

## 2018-12-26 ENCOUNTER — Encounter: Payer: Self-pay | Admitting: Family Medicine

## 2018-12-26 DIAGNOSIS — R7309 Other abnormal glucose: Secondary | ICD-10-CM | POA: Insufficient documentation

## 2019-10-03 ENCOUNTER — Ambulatory Visit (INDEPENDENT_AMBULATORY_CARE_PROVIDER_SITE_OTHER): Payer: 59 | Admitting: Family Medicine

## 2019-10-03 ENCOUNTER — Other Ambulatory Visit: Payer: Self-pay

## 2019-10-03 ENCOUNTER — Encounter: Payer: Self-pay | Admitting: Family Medicine

## 2019-10-03 DIAGNOSIS — M25511 Pain in right shoulder: Secondary | ICD-10-CM

## 2019-10-03 DIAGNOSIS — G8929 Other chronic pain: Secondary | ICD-10-CM

## 2019-10-03 MED ORDER — NAPROXEN 500 MG PO TABS: 500.0000 mg | ORAL_TABLET | Freq: Two times a day (BID) | ORAL | 1 refills | Status: DC

## 2019-10-03 NOTE — Patient Instructions (Addendum)
Recommend trial of Anti-inflammatory with Naproxen (Naprosyn) 500mg  tabs - take one with food and plenty of water TWICE daily every day (breakfast and dinner), for next 2 to 4 weeks, then you may take only as needed - DO NOT TAKE any ibuprofen, aleve, motrin while you are taking this medicine - It is safe to take Tylenol Ext Str 500mg  tabs - take 1 to 2 (max dose 1000mg ) every 6 hours as needed for breakthrough pain, max 24 hour daily dose is 6 to 8 tablets or 4000mg   Recommend to start taking Tylenol Extra Strength 500mg  tabs - take 1 to 2 tabs per dose (max 1000mg ) every 6-8 hours for pain (take regularly, don't skip a dose for next 7 days), max 24 hour daily dose is 6 tablets or 3000mg . In the future you can repeat the same everyday Tylenol course for 1-2 weeks at a time.  - This is safe to take with anti-inflammatory medicines (Ibuprofen, Advil, Naproxen, Aleve, Meloxicam, Mobic)  Let me now if want to try a muscle relaxant at night  Referral to Ortho stay tuned for apt  [facility] [address] [place],   Get Driving Directions Main:   Please schedule a Follow-up Appointment to: Return if symptoms worsen or fail to improve, for r shoulder injury .  If you have any other questions or concerns, please feel free to call the office or send a message through MyChart. You may also schedule an earlier appointment if necessary.  Additionally, you may be receiving a survey about your experience at our office within a few days to 1 week by e-mail or mail. We value your feedback.  , DO [facility], [address]

## 2019-10-03 NOTE — Progress Notes (Signed)
Virtual Visit via Telephone The purpose of this virtual visit is to provide medical care while limiting exposure to the novel coronavirus (COVID19) for both patient and office staff.  Consent was obtained for phone visit:  Yes.   Answered questions that patient had about telehealth interaction:  Yes.   I discussed the limitations, risks, security and privacy concerns of performing an evaluation and management service by telephone. I also discussed with the patient that there may be a patient responsible charge related to this service. The patient expressed understanding and agreed to proceed.  Patient Location: Home Provider Location: [facility])  ---------------------------------------------------------------------- Chief Complaint  Patient presents with  . Shoulder Injury    possible related to injured. Pt seems to think she might have pulled a muscle in the shoulder x 2-3 mths ago. The shoulder pain progressively got worse as time went on.     S: Reviewed CMA documentation. I have called patient and gathered additional HPI as follows:  RIGHT SHOULDER PAIN She is left hand dominant. 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 She can feel pain at top front shoulder, she has limitation with lifting arm straight out forward and increased difficulty with lifting out to her side is more difficult. She tried to allow it to rest. She has a constant pain even at rest, worse with activity and lifting, worse at night can wake up from pain. - Tried OTC Ibuprofen 800mg  PRN with temporary relief able to sleep better with it - No prior shoulder injury or surgery - Active on farm, needs to keep working on it   Denies any high risk travel to areas of current  concern for COVID19. Denies any known or suspected exposure to person with or possibly with COVID19.  Denies any fevers, chills, sweats, body ache, cough, shortness of breath, sinus pain or pressure, headache, abdominal pain, diarrhea, numbness tingling weakness in arm  Past Medical History:  Diagnosis Date  . Depression    Social History   Tobacco Use  . Smoking status: Current Every Day Smoker    Packs/day: 1.00    Years: 34.00    Pack years: 34.00    Types: Cigarettes  . Smokeless tobacco: Never Used  . Tobacco comment: Failed Chantix in past  Substance Use Topics  . Alcohol use: Not Currently    Alcohol/week: 2.0 standard drinks    Types: 2 Glasses of wine per week    Comment: glass of wine once a month  . Drug use: No    Current Outpatient Medications:  .  albuterol (PROVENTIL HFA;VENTOLIN HFA) 108 (90 Base) MCG/ACT inhaler, Inhale 2 puffs into the lungs every 6 (six) hours as needed for wheezing or shortness of breath., Disp: 1 Inhaler, Rfl: 5 .  Ascorbic Acid (VITAMIN C) 1000 MG tablet, Take 1,000 mg by mouth daily., Disp: , Rfl:  .  cetirizine (ZYRTEC) 10 MG tablet, Take 10 mg by mouth daily., Disp: , Rfl:  .  Cholecalciferol (VITAMIN D) 50 MCG (2000 UT) CAPS, Take by mouth., Disp: , Rfl:  .  Multiple Vitamin (MULTIVITAMIN) tablet, Take 1 tablet by mouth daily., Disp: , Rfl:  .  omeprazole (PRILOSEC) 10 MG capsule, Take 10 mg by mouth daily., Disp: , Rfl:  .  Specialty Vitamins Products (MENOPAUSE RELIEF PO), Take by mouth., Disp: , Rfl:  .  Fluticasone-Salmeterol (ADVAIR DISKUS) 250-50 MCG/DOSE AEPB, Inhale 1 puff into the lungs 2 (two) times daily. (Patient not taking: Reported on 10/03/2019), Disp: 1 each, Rfl: 3 .  naproxen (NAPROSYN) 500 MG tablet, Take 1 tablet (500 mg total) by mouth 2 (two) times daily with a meal. For 2-4 weeks then as needed, Disp: 60 tablet, Rfl: 1  Depression screen [facility] 2/9 10/03/2019 12/12/2018 07/08/2017  Decreased Interest 0 1 0  Down,  Depressed, Hopeless 0 0 0  PHQ - 2 Score 0 1 0  Altered sleeping 0 2 0  Tired, decreased energy 0 3 0  Change in appetite 0 1 0  Feeling bad or failure about yourself  0 0 0  Trouble concentrating 0 1 0  Moving slowly or fidgety/restless 0 0 0  Suicidal thoughts 0 0 0  PHQ-9 Score 0 8 0  Difficult doing work/chores Not difficult at all Not difficult at all Not difficult at all    GAD 7 : Generalized Anxiety Score 12/12/2018  Nervous, Anxious, on Edge 0  Control/stop worrying 0  Worry too much - different things 0  Trouble relaxing 0  Restless 0  Easily annoyed or irritable 3  Afraid - awful might happen 0  Total GAD 7 Score 3  Anxiety Difficulty Not difficult at all    -------------------------------------------------------------------------- O: No physical exam performed due to remote telephone encounter.  Lab results reviewed.  No results found for this or any previous visit (from the past 2160 hour(s)).  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 No traumatic injury however No known underlying osteoarthritis No imaging available  Plan: Offered steroid injection subacromial bursa, will defer as she opts to proceed with Orthopedic evaluation next - Also offered muscle relaxant rx PRN but she declined now, will reconsider  Referral to Orthopedic specialist for next step diagnostic /  management, to expedite her treatment, based on her location she requests [place]  1. Start rx Naproxen 500mg  twice daily (with food) for 2 weeks, then as needed 2. May take Tylenol Ex Str 1-2 q 6 hr PRN 3. Relative rest but keep shoulder mobile, demonstrated ROM exercises, avoid heavy lifting 4. May try heating pad PRN   Orders Placed This Encounter  Procedures  . Ambulatory referral to Orthopedic Surgery    Referral Priority:   Routine    Referral Type:   Surgical    Referral Reason:   Specialty Services Required    Requested Specialty:   Orthopedic Surgery    Number of Visits Requested:   1     Meds ordered this encounter  Medications  . naproxen (NAPROSYN) 500 MG tablet    Sig: Take 1 tablet (500 mg total) by mouth 2 (two) times daily with a meal. For 2-4 weeks then as needed    Dispense:  60 tablet  Refill:  1    Follow-up: - Return as needed for shoulder  Patient verbalizes understanding with the above medical recommendations including the limitation of remote medical advice.  Specific follow-up and call-back criteria were given for patient to follow-up or seek medical care more urgently if needed.   - Time spent in direct consultation with patient on phone: 17 minutes  Saralyn Pilar, DO [facility] 10/03/2019, 8:11 AM

## 2019-10-11 ENCOUNTER — Ambulatory Visit: Payer: 59

## 2019-10-11 ENCOUNTER — Other Ambulatory Visit: Payer: Self-pay

## 2019-10-11 ENCOUNTER — Ambulatory Visit (INDEPENDENT_AMBULATORY_CARE_PROVIDER_SITE_OTHER): Payer: 59 | Admitting: Orthopaedic Surgery

## 2019-10-11 ENCOUNTER — Encounter: Payer: Self-pay | Admitting: Orthopaedic Surgery

## 2019-10-11 VITALS — BP 137/80 | HR 70 | Temp 97.2°F | Ht 67.0 in | Wt 190.0 lb

## 2019-10-11 DIAGNOSIS — M7501 Adhesive capsulitis of right shoulder: Secondary | ICD-10-CM | POA: Diagnosis not present

## 2019-10-11 DIAGNOSIS — G8929 Other chronic pain: Secondary | ICD-10-CM

## 2019-10-11 DIAGNOSIS — F17210 Nicotine dependence, cigarettes, uncomplicated: Secondary | ICD-10-CM

## 2019-10-11 DIAGNOSIS — M25511 Pain in right shoulder: Secondary | ICD-10-CM | POA: Diagnosis not present

## 2019-10-11 NOTE — Patient Instructions (Signed)
Steps to Quit Smoking Smoking tobacco is the leading cause of preventable death. It can affect almost every organ in the body. Smoking puts you and people around you at risk for many serious, long-lasting (chronic) diseases. Quitting smoking can be hard, but it is one of the best things that you can do for your health. It is never too late to quit. How do I get ready to quit? When you decide to quit smoking, make a plan to help you succeed. Before you quit:  Pick a date to quit. Set a date within the next 2 weeks to give you time to prepare.  Write down the reasons why you are quitting. Keep this list in places where you will see it often.  Tell your family, friends, and co-workers that you are quitting. Their support is important.  Talk with your doctor about the choices that may help you quit.  Find out if your health insurance will pay for these treatments.  Know the people, places, things, and activities that make you want to smoke (triggers). Avoid them. What first steps can I take to quit smoking?  Throw away all cigarettes at home, at work, and in your car.  Throw away the things that you use when you smoke, such as ashtrays and lighters.  Clean your car. Make sure to empty the ashtray.  Clean your home, including curtains and carpets. What can I do to help me quit smoking? Talk with your doctor about taking medicines and seeing a counselor at the same time. You are more likely to succeed when you do both.  If you are pregnant or breastfeeding, talk with your doctor about counseling or other ways to quit smoking. Do not take medicine to help you quit smoking unless your doctor tells you to do so. To quit smoking: Quit right away  Quit smoking totally, instead of slowly cutting back on how much you smoke over a period of time.  Go to counseling. You are more likely to quit if you go to counseling sessions regularly. Take medicine You may take medicines to help you quit. Some  medicines need a prescription, and some you can buy over-the-counter. Some medicines may contain a drug called nicotine to replace the nicotine in cigarettes. Medicines may:  Help you to stop having the desire to smoke (cravings).  Help to stop the problems that come when you stop smoking (withdrawal symptoms). Your doctor may ask you to use:  Nicotine patches, gum, or lozenges.  Nicotine inhalers or sprays.  Non-nicotine medicine that is taken by mouth. Find resources Find resources and other ways to help you quit smoking and remain smoke-free after you quit. These resources are most helpful when you use them often. They include:  Online chats with a counselor.  Phone quitlines.  Printed self-help materials.  Support groups or group counseling.  Text messaging programs.  Mobile phone apps. Use apps on your mobile phone or tablet that can help you stick to your quit plan. There are many free apps for mobile phones and tablets as well as websites. Examples include Quit Guide from the CDC and smokefree.gov  What things can I do to make it easier to quit?   Talk to your family and friends. Ask them to support and encourage you.  Call a phone quitline (1-800-QUIT-NOW), reach out to support groups, or work with a counselor.  Ask people who smoke to not smoke around you.  Avoid places that make you want to smoke,   such as: ? Bars. ? Parties. ? Smoke-break areas at work.  Spend time with people who do not smoke.  Lower the stress in your life. Stress can make you want to smoke. Try these things to help your stress: ? Getting regular exercise. ? Doing deep-breathing exercises. ? Doing yoga. ? Meditating. ? Doing a body scan. To do this, close your eyes, focus on one area of your body at a time from head to toe. Notice which parts of your body are tense. Try to relax the muscles in those areas. How will I feel when I quit smoking? Day 1 to 3 weeks Within the first 24 hours,  you may start to have some problems that come from quitting tobacco. These problems are very bad 2-3 days after you quit, but they do not often last for more than 2-3 weeks. You may get these symptoms:  Mood swings.  Feeling restless, nervous, angry, or annoyed.  Trouble concentrating.  Dizziness.  Strong desire for high-sugar foods and nicotine.  Weight gain.  Trouble pooping (constipation).  Feeling like you may vomit (nausea).  Coughing or a sore throat.  Changes in how the medicines that you take for other issues work in your body.  Depression.  Trouble sleeping (insomnia). Week 3 and afterward After the first 2-3 weeks of quitting, you may start to notice more positive results, such as:  Better sense of smell and taste.  Less coughing and sore throat.  Slower heart rate.  Lower blood pressure.  Clearer skin.  Better breathing.  Fewer sick days. Quitting smoking can be hard. Do not give up if you fail the first time. Some people need to try a few times before they succeed. Do your best to stick to your quit plan, and talk with your doctor if you have any questions or concerns. Summary  Smoking tobacco is the leading cause of preventable death. Quitting smoking can be hard, but it is one of the best things that you can do for your health.  When you decide to quit smoking, make a plan to help you succeed.  Quit smoking right away, not slowly over a period of time.  When you start quitting, seek help from your doctor, family, or friends. This information is not intended to replace advice given to you by your health care provider. Make sure you discuss any questions you have with your health care provider. Document Revised: 06/08/2019 Document Reviewed: 12/02/2018 Elsevier Patient Education  2020 Elsevier Inc.  

## 2019-10-11 NOTE — Progress Notes (Signed)
Subjective:    Patient ID: Jacqueline [name], female    DOB: Apr 18, 1961, 59 y.o.   MRN: [id number]  HPI She was helping dig posts for fences in August and hurt her right nondominant shoulder. She has had pain since then.  It has gotten progressively worse.  She has pain at night and in trying to lift it up now.  She has tried Advil 800 mgm several times a day with little help.  She has no redness, no swelling, no numbness.  She is tired of it hurting and not moving.  I have read her notes from Jacqueline [name].  She has not had x-rays.   Review of Systems  Constitutional: Positive for activity change.  Musculoskeletal: Positive for arthralgias.  All other systems reviewed and are negative.  For Review of Systems, all other systems reviewed and are negative.  The following is a summary of the past history medically, past history surgically, known current medicines, social history and family history.  This information is gathered electronically by the computer from prior information and documentation.  I review this each visit and have found including this information at this point in the chart is beneficial and informative.   Past Medical History:  Diagnosis Date  . Arthritis   . Asthma   . Cataract   . Depression   . Joint pain   . Nausea   . Smoker     Past Surgical History:  Procedure Laterality Date  . ABDOMINAL HYSTERECTOMY  2000  . APPENDECTOMY  1981  . TONSILLECTOMY     59 years old    Current Outpatient Medications on File Prior to Visit  Medication Sig Dispense Refill  . albuterol (PROVENTIL HFA;VENTOLIN HFA) 108 (90 Base) MCG/ACT inhaler Inhale 2 puffs into the lungs every 6 (six) hours as needed for wheezing or shortness of breath. 1 Inhaler 5  . Ascorbic Acid (VITAMIN C) 1000 MG tablet Take 1,000 mg by mouth daily.    . cetirizine (ZYRTEC) 10 MG tablet Take 10 mg by mouth daily.    . Cholecalciferol (VITAMIN D) 50 MCG (2000 UT) CAPS Take by mouth.    .  Fluticasone-Salmeterol (ADVAIR DISKUS) 250-50 MCG/DOSE AEPB Inhale 1 puff into the lungs 2 (two) times daily. 1 each 3  . Multiple Vitamin (MULTIVITAMIN) tablet Take 1 tablet by mouth daily.    . naproxen (NAPROSYN) 500 MG tablet Take 1 tablet (500 mg total) by mouth 2 (two) times daily with a meal. For 2-4 weeks then as needed 60 tablet 1  . omeprazole (PRILOSEC) 10 MG capsule Take 10 mg by mouth daily.    Marland Kitchen Specialty Vitamins Products (MENOPAUSE RELIEF PO) Take by mouth.     No current facility-administered medications on file prior to visit.    Social History   Socioeconomic History  . Marital status: Divorced    Spouse name: Not on file  . Number of children: Not on file  . Years of education: Not on file  . Highest education level: Not on file  Occupational History  . Not on file  Tobacco Use  . Smoking status: Current Every Day Smoker    Packs/day: 1.00    Years: 34.00    Pack years: 34.00    Types: Cigarettes  . Smokeless tobacco: Never Used  . Tobacco comment: Failed Chantix in past  Substance and Sexual Activity  . Alcohol use: Not Currently    Alcohol/week: 2.0 standard drinks    Types: 2 Glasses  of wine per week    Comment: glass of wine once a month  . Drug use: No  . Sexual activity: Yes  Other Topics Concern  . Not on file  Social History Narrative  . Not on file   Social Determinants of Health   Financial Resource Strain:   . Difficulty of Paying Living Expenses: Not on file  Food Insecurity:   . Worried About Programme researcher, broadcasting/film/video in the Last Year: Not on file  . Ran Out of Food in the Last Year: Not on file  Transportation Needs:   . Lack of Transportation (Medical): Not on file  . Lack of Transportation (Non-Medical): Not on file  Physical Activity:   . Days of Exercise per Week: Not on file  . Minutes of Exercise per Session: Not on file  Stress:   . Feeling of Stress : Not on file  Social Connections:   . Frequency of Communication with Friends  and Family: Not on file  . Frequency of Social Gatherings with Friends and Family: Not on file  . Attends Religious Services: Not on file  . Active Member of Clubs or Organizations: Not on file  . Attends Banker Meetings: Not on file  . Marital Status: Not on file  Intimate Partner Violence:   . Fear of Current or Ex-Partner: Not on file  . Emotionally Abused: Not on file  . Physically Abused: Not on file  . Sexually Abused: Not on file    Family History  Adopted: Yes  Problem Relation Age of Onset  . Diabetes Son     BP 137/80   Pulse 70   Temp (!) 97.2 F (36.2 C)   Ht 5\' 7"  (1.702 m)   Wt 190 lb (86.2 kg)   BMI 29.76 kg/m   Body mass index is 29.76 kg/m.     Objective:   Physical Exam Vitals and nursing note reviewed.  Constitutional:      Appearance: She is well-developed.  HENT:     Head: Normocephalic and atraumatic.  Eyes:     Conjunctiva/sclera: Conjunctivae normal.     Pupils: Pupils are equal, round, and reactive to light.  Cardiovascular:     Rate and Rhythm: Normal rate and regular rhythm.  Pulmonary:     Effort: Pulmonary effort is normal.  Abdominal:     Palpations: Abdomen is soft.  Musculoskeletal:       Arms:     Cervical back: Normal range of motion and neck supple.  Skin:    General: Skin is warm and dry.  Neurological:     Mental Status: She is alert and oriented to person, place, and time.     Cranial Nerves: No cranial nerve deficit.     Motor: No abnormal muscle tone.     Coordination: Coordination normal.     Deep Tendon Reflexes: Reflexes are normal and symmetric. Reflexes normal.  Psychiatric:        Behavior: Behavior normal.        Thought Content: Thought content normal.        Judgment: Judgment normal.    X-rays were done of the right shoulder, reported separately.       Assessment & Plan:   Encounter Diagnoses  Name Primary?  . Chronic right shoulder pain Yes  . Adhesive capsulitis of right  shoulder   . Nicotine dependence, cigarettes, uncomplicated    I will get MRI of the right shoulder as I am  concerned about rotator cuff injury.  She has adhesive capsulitis now also.  PROCEDURE NOTE:  The patient request injection, verbal consent was obtained.  The right shoulder was prepped appropriately after time out was performed.   Sterile technique was observed and injection of 1 cc of Depo-Medrol 40 mg with several cc's of plain xylocaine. Anesthesia was provided by ethyl chloride and a 20-gauge needle was used to inject the shoulder area. A posterior approach was used.  The injection was tolerated well.  A band aid dressing was applied.  The patient was advised to apply ice later today and tomorrow to the injection sight as needed.  Continue her Motrin.  Return in two weeks after MRI.  Call if any problem.  Precautions discussed.   Electronically Signed Darreld Mclean, MD 1/14/202111:23 AM

## 2019-10-18 ENCOUNTER — Other Ambulatory Visit: Payer: Self-pay

## 2019-10-18 ENCOUNTER — Ambulatory Visit (HOSPITAL_COMMUNITY)
Admission: RE | Admit: 2019-10-18 | Discharge: 2019-10-18 | Disposition: A | Discharge status: Home / Self Care | Payer: 59 | Source: Ambulatory Visit | Attending: Orthopaedic Surgery | Admitting: Orthopaedic Surgery

## 2019-10-18 DIAGNOSIS — M25511 Pain in right shoulder: Secondary | ICD-10-CM | POA: Diagnosis present

## 2019-10-18 DIAGNOSIS — G8929 Other chronic pain: Secondary | ICD-10-CM | POA: Insufficient documentation

## 2019-10-25 ENCOUNTER — Other Ambulatory Visit: Payer: Self-pay

## 2019-10-25 ENCOUNTER — Encounter: Payer: Self-pay | Admitting: Orthopaedic Surgery

## 2019-10-25 ENCOUNTER — Ambulatory Visit (INDEPENDENT_AMBULATORY_CARE_PROVIDER_SITE_OTHER): Payer: 59 | Admitting: Orthopaedic Surgery

## 2019-10-25 VITALS — BP 131/89 | HR 80 | Temp 97.3°F | Ht 67.0 in | Wt 190.0 lb

## 2019-10-25 DIAGNOSIS — M25511 Pain in right shoulder: Secondary | ICD-10-CM

## 2019-10-25 DIAGNOSIS — F17210 Nicotine dependence, cigarettes, uncomplicated: Secondary | ICD-10-CM | POA: Diagnosis not present

## 2019-10-25 DIAGNOSIS — G8929 Other chronic pain: Secondary | ICD-10-CM | POA: Diagnosis not present

## 2019-10-25 NOTE — Patient Instructions (Signed)
Steps to Quit Smoking Smoking tobacco is the leading cause of preventable death. It can affect almost every organ in the body. Smoking puts you and people around you at risk for many serious, long-lasting (chronic) diseases. Quitting smoking can be hard, but it is one of the best things that you can do for your health. It is never too late to quit. How do I get ready to quit? When you decide to quit smoking, make a plan to help you succeed. Before you quit:  Pick a date to quit. Set a date within the next 2 weeks to give you time to prepare.  Write down the reasons why you are quitting. Keep this list in places where you will see it often.  Tell your family, friends, and co-workers that you are quitting. Their support is important.  Talk with your doctor about the choices that may help you quit.  Find out if your health insurance will pay for these treatments.  Know the people, places, things, and activities that make you want to smoke (triggers). Avoid them. What first steps can I take to quit smoking?  Throw away all cigarettes at home, at work, and in your car.  Throw away the things that you use when you smoke, such as ashtrays and lighters.  Clean your car. Make sure to empty the ashtray.  Clean your home, including curtains and carpets. What can I do to help me quit smoking? Talk with your doctor about taking medicines and seeing a counselor at the same time. You are more likely to succeed when you do both.  If you are pregnant or breastfeeding, talk with your doctor about counseling or other ways to quit smoking. Do not take medicine to help you quit smoking unless your doctor tells you to do so. To quit smoking: Quit right away  Quit smoking totally, instead of slowly cutting back on how much you smoke over a period of time.  Go to counseling. You are more likely to quit if you go to counseling sessions regularly. Take medicine You may take medicines to help you quit. Some  medicines need a prescription, and some you can buy over-the-counter. Some medicines may contain a drug called nicotine to replace the nicotine in cigarettes. Medicines may:  Help you to stop having the desire to smoke (cravings).  Help to stop the problems that come when you stop smoking (withdrawal symptoms). Your doctor may ask you to use:  Nicotine patches, gum, or lozenges.  Nicotine inhalers or sprays.  Non-nicotine medicine that is taken by mouth. Find resources Find resources and other ways to help you quit smoking and remain smoke-free after you quit. These resources are most helpful when you use them often. They include:  Online chats with a counselor.  Phone quitlines.  Printed self-help materials.  Support groups or group counseling.  Text messaging programs.  Mobile phone apps. Use apps on your mobile phone or tablet that can help you stick to your quit plan. There are many free apps for mobile phones and tablets as well as websites. Examples include Quit Guide from the CDC and smokefree.gov  What things can I do to make it easier to quit?   Talk to your family and friends. Ask them to support and encourage you.  Call a phone quitline (1-800-QUIT-NOW), reach out to support groups, or work with a counselor.  Ask people who smoke to not smoke around you.  Avoid places that make you want to smoke,   such as: ? Bars. ? Parties. ? Smoke-break areas at work.  Spend time with people who do not smoke.  Lower the stress in your life. Stress can make you want to smoke. Try these things to help your stress: ? Getting regular exercise. ? Doing deep-breathing exercises. ? Doing yoga. ? Meditating. ? Doing a body scan. To do this, close your eyes, focus on one area of your body at a time from head to toe. Notice which parts of your body are tense. Try to relax the muscles in those areas. How will I feel when I quit smoking? Day 1 to 3 weeks Within the first 24 hours,  you may start to have some problems that come from quitting tobacco. These problems are very bad 2-3 days after you quit, but they do not often last for more than 2-3 weeks. You may get these symptoms:  Mood swings.  Feeling restless, nervous, angry, or annoyed.  Trouble concentrating.  Dizziness.  Strong desire for high-sugar foods and nicotine.  Weight gain.  Trouble pooping (constipation).  Feeling like you may vomit (nausea).  Coughing or a sore throat.  Changes in how the medicines that you take for other issues work in your body.  Depression.  Trouble sleeping (insomnia). Week 3 and afterward After the first 2-3 weeks of quitting, you may start to notice more positive results, such as:  Better sense of smell and taste.  Less coughing and sore throat.  Slower heart rate.  Lower blood pressure.  Clearer skin.  Better breathing.  Fewer sick days. Quitting smoking can be hard. Do not give up if you fail the first time. Some people need to try a few times before they succeed. Do your best to stick to your quit plan, and talk with your doctor if you have any questions or concerns. Summary  Smoking tobacco is the leading cause of preventable death. Quitting smoking can be hard, but it is one of the best things that you can do for your health.  When you decide to quit smoking, make a plan to help you succeed.  Quit smoking right away, not slowly over a period of time.  When you start quitting, seek help from your doctor, family, or friends. This information is not intended to replace advice given to you by your health care provider. Make sure you discuss any questions you have with your health care provider. Document Revised: 06/08/2019 Document Reviewed: 12/02/2018 Elsevier Patient Education  2020 Elsevier Inc.  

## 2019-10-25 NOTE — Progress Notes (Signed)
Patient Jacqueline [name], female DOB:May 16, 1961, 59 y.o. XBD:[id number]  Chief Complaint  Patient presents with  . Shoulder Pain    Chronic right shoulder pain    HPI  Jacqueline [name] is a 59 y.o. female who has right shoulder pain.  She had MRI of the right shoulder showing: IMPRESSION: 1. High-grade near full-thickness tear of the mid to posterior insertional fibers of the supraspinatus tendon. 2. Mild subscapularis tendinosis with fraying of the distal tendon superiorly. 3. Mild glenohumeral and acromioclavicular osteoarthritis.  I have explained the findings to her.  I have recommended she see Dr. Romeo Apple for possible surgery.  She agrees. She could not tolerate the naprosyn so I told her to stop it.  Body mass index is 29.76 kg/m.  ROS  Review of Systems  Constitutional: Positive for activity change.  Musculoskeletal: Positive for arthralgias.  All other systems reviewed and are negative.   All other systems reviewed and are negative.  The following is a summary of the past history medically, past history surgically, known current medicines, social history and family history.  This information is gathered electronically by the computer from prior information and documentation.  I review this each visit and have found including this information at this point in the chart is beneficial and informative.    Past Medical History:  Diagnosis Date  . Arthritis   . Asthma   . Cataract   . Depression   . Joint pain   . Nausea   . Smoker     Past Surgical History:  Procedure Laterality Date  . ABDOMINAL HYSTERECTOMY  2000  . APPENDECTOMY  1981  . TONSILLECTOMY     59 years old    Family History  Adopted: Yes  Problem Relation Age of Onset  . Diabetes Son     Social History Social History   Tobacco Use  . Smoking status: Current Every Day Smoker    Packs/day: 1.00    Years: 34.00    Pack years: 34.00    Types: Cigarettes  . Smokeless tobacco:  Never Used  . Tobacco comment: Failed Chantix in past  Substance Use Topics  . Alcohol use: Not Currently    Alcohol/week: 2.0 standard drinks    Types: 2 Glasses of wine per week    Comment: glass of wine once a month  . Drug use: No    No Known Allergies  Current Outpatient Medications  Medication Sig Dispense Refill  . albuterol (PROVENTIL HFA;VENTOLIN HFA) 108 (90 Base) MCG/ACT inhaler Inhale 2 puffs into the lungs every 6 (six) hours as needed for wheezing or shortness of breath. 1 Inhaler 5  . Ascorbic Acid (VITAMIN C) 1000 MG tablet Take 1,000 mg by mouth daily.    . cetirizine (ZYRTEC) 10 MG tablet Take 10 mg by mouth daily.    . Cholecalciferol (VITAMIN D) 50 MCG (2000 UT) CAPS Take by mouth.    . Fluticasone-Salmeterol (ADVAIR DISKUS) 250-50 MCG/DOSE AEPB Inhale 1 puff into the lungs 2 (two) times daily. 1 each 3  . Multiple Vitamin (MULTIVITAMIN) tablet Take 1 tablet by mouth daily.    . naproxen (NAPROSYN) 500 MG tablet Take 1 tablet (500 mg total) by mouth 2 (two) times daily with a meal. For 2-4 weeks then as needed 60 tablet 1  . omeprazole (PRILOSEC) 10 MG capsule Take 10 mg by mouth daily.    Marland [employer] Specialty Vitamins Products (MENOPAUSE RELIEF PO) Take by mouth.     No current facility-administered  medications for this visit.     Physical Exam  Blood pressure 131/89, pulse 80, temperature (!) 97.3 F (36.3 C), height 5\' 7"  (1.702 m), weight 190 lb (86.2 kg).  Constitutional: overall normal hygiene, normal nutrition, well developed, normal grooming, normal body habitus. Assistive device:none  Musculoskeletal: gait and station Limp none, muscle tone and strength are normal, no tremors or atrophy is present.  .  Neurological: coordination overall normal.  Deep tendon reflex/nerve stretch intact.  Sensation normal.  Cranial nerves II-XII intact.   Skin:   Normal overall no scars, lesions, ulcers or rashes. No psoriasis.  Psychiatric: Alert and oriented x 3.  Recent  memory intact, remote memory unclear.  Normal mood and affect. Well groomed.  Good eye contact.  Cardiovascular: overall no swelling, no varicosities, no edema bilaterally, normal temperatures of the legs and arms, no clubbing, cyanosis and good capillary refill.  Lymphatic: palpation is normal.  Right shoulder tender, decreased ROM. All other systems reviewed and are negative   The patient has been educated about the nature of the problem(s) and counseled on treatment options.  The patient appeared to understand what I have discussed and is in agreement with it.  Encounter Diagnoses  Name Primary?  . Chronic right shoulder pain Yes  . Nicotine dependence, cigarettes, uncomplicated     PLAN Call if any problems.  Precautions discussed.  Continue current medications except for the Naprosyn.  Return to clinic see Dr. Aline Brochure   Electronically Signed Sanjuana Kava, MD 1/28/20219:46 AM

## 2019-10-30 ENCOUNTER — Encounter: Payer: Self-pay | Admitting: Orthopedic Surgery

## 2019-10-30 ENCOUNTER — Ambulatory Visit (INDEPENDENT_AMBULATORY_CARE_PROVIDER_SITE_OTHER): Payer: 59 | Admitting: Orthopedic Surgery

## 2019-10-30 ENCOUNTER — Other Ambulatory Visit: Payer: Self-pay

## 2019-10-30 VITALS — BP 135/74 | HR 84 | Temp 98.1°F | Ht 67.0 in | Wt 190.0 lb

## 2019-10-30 DIAGNOSIS — F17210 Nicotine dependence, cigarettes, uncomplicated: Secondary | ICD-10-CM | POA: Diagnosis not present

## 2019-10-30 DIAGNOSIS — S46011A Strain of muscle(s) and tendon(s) of the rotator cuff of right shoulder, initial encounter: Secondary | ICD-10-CM | POA: Diagnosis not present

## 2019-10-30 NOTE — Patient Instructions (Signed)
Surgery for Rotator Cuff Tear  Rotator cuff surgery is only recommended for individuals who have experienced persistent disability for greater than 3 months of non-surgical (conservative) treatment. Surgery is not necessary but is recommended for individuals who experience difficulty completing daily activities or athletes who are unable to compete. Rotator cuff tears do not usually heal without surgical intervention. If left alone small rotator cuff tears usually become larger. Younger athletes who have a rotator cuff tear may be recommended for surgery without attempting conservative rehabilitation. The purpose of surgery is to regain function of the shoulder joint and eliminate pain associated with the injury. In addition to repairing the tendon tear, the surgery may often remove a portion of the bony roof of the shoulder (acromion) as well as the chronically thickened and inflamed membrane below the acromion (subacromial bursa). REASONS NOT TO OPERATE  Infection of the shoulder. Inability to complete a rehabilitation program. Patients who have other conditions (emotional or psychological) conditions that contribute to their shoulder condition. RISKS AND COMPLICATIONS Infection. Re-tear of the rotator cuff tendons or muscles. Shoulder stiffness and/or weakness. Inability to compete in athletics. Acromioclavicular (AC) joint pain Risks of surgery: infection, bleeding, nerve damage, or damage to surrounding tissues. TECHNIQUE There are different surgical procedures used to treat rotator cuff tears. The type of procedure depends on the extent of injury as well as the surgeon's preference. All of the surgical techniques for rotator cuff tears have the same goal of repairing the torn tendon, removing part of the acromion, and removing the subacromial bursa. There are two main types of procedures: arthroscopic and open incision. Arthroscopic procedures are usually completed and you go home the same day  as surgery (outpatient). These procedures use multiple small incisions in which tools and a video camera are placed to work on the shoulder. An electric shaver removes the bursa, then a power burr shaves down the portion of the acromion that places pressure on the rotator cuff. Finally the rotator cuff is sewed (sutured) back to the humeral head. Open incision procedures require a larger incision. The deltoid muscle is detached from the acromion and a ligament in the shoulder (coracoacromial) is cut in order for the surgeon to access the rotator cuff. The subacromial bursa is removed as well as part of the acromion to give the rotator cuff room to move freely. The torn tendon is then sutured to the humeral head. After the rotator cuff is repaired, then the deltoid is reattached and the incision is closed up.  RECOVERY  Post-operative care depends on the surgical technique and the preferences of your therapist. Keep the wound clean and dry for the first 10 to 14 days after surgery. Keep your shoulder and arm in the sling provided to you for as long as you have been instructed to. You will be given pain medications by your caregiver. Passive (without using muscles) shoulder movements may be begun when instructed. It is important to follow through with you rehabilitation program in order to have the best possible recovery. RETURN TO SPORTS  The rehabilitation period will depend on the sport and position you play as well as the success of the operation. The minimum recovery period is 6 months. You must have regained complete shoulder motion and strength before returning to sports. SEEK IMMEDIATE MEDICAL CARE IF:  Any medications produce adverse side effects. Any complications from surgery occur: Pain, numbness, or coldness in the extremity operated upon. Discoloration of the nail beds (they become blue or gray) of   the extremity operated upon. Signs of infections (fever, pain, inflammation, redness, or  persistent bleeding).   You have decided to proceed with rotator cuff repair surgery. You have decided not to continue with nonoperative measures such as but not limited to oral medication,   activity modification, physical therapy, bracing, or injection.  We will perform rotator cuff repair. Some of the risks associated with rotator cuff repair include but are not limited to Bleeding Infection Swelling Stiffness Blood clot Pain Re-tearing of the rotator cuff Failure of the rotator cuff to heal   If you're not comfortable with these risks and would like to continue with nonoperative treatment please let Dr. Nyana Haren know prior to your surgery.  

## 2019-10-30 NOTE — Progress Notes (Signed)
PREOP CONSULT/REFERRAL INTRA-OFFICE FROM DR Tyrone Apple   Chief Complaint  Patient presents with  . Shoulder Pain    Right shoulder pain, consult from Dr. Luna Glasgow.    This is a 59 year old female who presents at Dr. Brooke Bonito request for possible surgery.  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  She has decreased range of motion persistent pain in the right anterior shoulder   Review of Systems  Constitutional: Negative for fever.  Respiratory:       Smoker without shortness of breath  Cardiovascular: Negative for chest pain.  Neurological: Negative for tingling.     Past Medical History:  Diagnosis Date  . Arthritis   . Asthma   . Cataract   . Depression   . Joint pain   . Nausea   . Smoker     Past Surgical History:  Procedure Laterality Date  . ABDOMINAL HYSTERECTOMY  2000  . APPENDECTOMY  1981  . TONSILLECTOMY     59 years old    Family History  Adopted: Yes  Problem Relation Age of Onset  . Diabetes Son    Social History   Tobacco Use  . Smoking status: Current Every Day Smoker    Packs/day: 1.00    Years: 34.00    Pack years: 34.00    Types: Cigarettes  . Smokeless tobacco: Never Used  . Tobacco comment: Failed Chantix in past  Substance Use Topics  . Alcohol use: Not Currently    Alcohol/week: 2.0 standard drinks    Types: 2 Glasses of wine per week    Comment: glass of wine once a month  . Drug use: No    No Known Allergies   No outpatient medications have been marked as taking for the 10/30/19 encounter (Office Visit) with Carole Civil, MD.    BP 135/74   Pulse 84    Temp 98.1 F (36.7 C)   Ht 5\' 7"  (1.702 m)   Wt 190 lb (86.2 kg)   BMI 29.76 kg/m   Physical Exam She is well-developed well-nourished grooming and hygiene are normal she is oriented x3 mood and affect are normal   Ortho Exam   No gait disturbances are noted.  She has tenderness over the anterolateral right shoulder joint and off the acromion her range of motion is 30 degrees external rotation and 50 degrees of abduction  I did a rotator cuff lidocaine injection test in the subacromial space and repeated the examination at which time she externally rotated fully I was able to elevate her arm up to 150 degrees at which she did have pain from 120 through 150 degrees with weakness in the rotator cuff primarily supraspinatus.  Shoulder was stable  Neurovascular exam was intact and skin was normal  MEDICAL DECISION SECTION  xrays ordered? no  My independent reading of xrays: Plain films look normal  MRI reveals a torn rotator cuff is a small tear with minimal retraction primarily involving the supraspinatus tendon   Encounter Diagnoses  Name Primary?  . Traumatic complete tear of right rotator cuff, initial encounter Yes  .  Nicotine dependence, cigarettes, uncomplicated      PLAN:   Surgical procedure planned: Arthroscopic repair right shoulder rotator cuff   The procedure has been fully reviewed with the patient; The risks and benefits of surgery have been discussed and explained and understood. Alternative treatment has also been reviewed, questions were encouraged and answered. The postoperative plan is also been reviewed.  Nonsurgical treatment as described in the history and physical section was attempted and unsuccessful and the patient has agreed to proceed with surgical intervention to improve their situation.  No orders of the defined types were placed in this encounter.   Fuller Canada, MD 10/30/2019 3:39 PM

## 2019-11-06 NOTE — Patient Instructions (Signed)
Jacqueline DownsVictoria M [name]  11/06/2019     @PREFPERIOPPHARMACY @   Your procedure is scheduled on  2/182021.  [facility] at  [phone]  A.M.  Call this number if you have problems the morning of surgery:  [phone]   Remember:  Do not eat or drink after midnight.                        Take these medicines the morning of surgery with A SIP OF WATER  prilosec.    Do not wear jewelry, make-up or nail polish.  Do not wear lotions, powders, or perfumes. Please brush your teeth.  Do not shave 48 hours prior to surgery.  Men may shave face and neck.  Do not bring valuables to the hospital.  [facility] is not responsible for any belongings or valuables.  Contacts, dentures or bridgework may not be worn into surgery.  Leave your suitcase in the car.  After surgery it may be brought to your room.  For patients admitted to the hospital, discharge time will be determined by your treatment team.  Patients discharged the day of surgery will not be allowed to drive home.   Name and phone number of your driver:   family Special instructions:  None  Please read over the following fact sheets that you were given. Anesthesia Post-op Instructions and Care and Recovery After Surgery       Surgery for Rotator Cuff Tear, Care After This sheet gives you information about how to care for yourself after your procedure. Your health care provider may also give you more specific instructions. If you have problems or questions, contact your health care provider. What can I expect after the procedure? After the procedure, it is common to have:  Swelling.  Pain.  Stiffness.  Tenderness. Follow these instructions at home: If you have a sling or a shoulder immobilizer:  Wear it as told by your health care provider. Remove it only as told by your health care provider.  Loosen it if your fingers tingle, become numb, or turn cold and blue.  Keep it clean. Bathing  Do not take  baths, swim, or use a hot tub until your health care provider approves. Ask your health care provider if you may take showers. You may only be allowed to take sponge baths.  Keep your bandage (dressing) dry until your health care provider says it can be removed.  If your sling or shoulder immobilizer is not waterproof: ? Do not let it get wet. ? Remove it when you take a bath or shower as told by your health care provider. Once the sling or shoulder immobilizer is removed, try not to move your shoulder until your health care provider says that you can. Incision care   Follow instructions from your health care provider about how to take care of your incision. Make sure you: ? Wash your hands with soap and water before and after you change your dressing. If soap and water are not available, use hand sanitizer. ? Change your dressing as told by your health care provider. ? Leave stitches (sutures), skin glue, or adhesive strips in place. These skin closures may need to stay in place for 2 weeks or longer. If adhesive strip edges start to loosen and curl up, you may trim the loose edges. Do not remove adhesive strips completely unless your health care provider tells you to  do that.  Check your incision area every day for signs of infection. Check for: ? More redness, swelling, or pain. ? More fluid or blood. ? Warmth. ? Pus or a bad smell. Managing pain, stiffness, and swelling   If directed, put ice on your shoulder area. ? Put ice in a plastic bag. ? Place a towel between your skin and the bag. ? Leave the ice on for 20 minutes, 2-3 times a day.  Move your fingers often to reduce stiffness and swelling.  Raise (elevate) your upper body on pillows when you lie down and when you sleep. ? Do not sleep on the front of your body (abdomen). ? Do not sleep on the side that your surgery was performed on. Medicines  Take over-the-counter and prescription medicines only as told by your health  care provider.  Ask your health care provider if the medicine prescribed to you: ? Requires you to avoid driving or using heavy machinery. ? Can cause constipation. You may need to take actions to prevent or treat constipation, such as:  Drink enough fluid to keep your urine pale yellow.  Take over-the-counter or prescription medicines.  Eat foods that are high in fiber, such as beans, whole grains, and fresh fruits and vegetables.  Limit foods that are high in fat and processed sugars, such as fried or sweet foods. Driving  Do not drive for 24 hours if you were given a sedative during your procedure.  Do not drive while wearing a sling or a shoulder immobilizer. Ask your health care provider when it is safe to drive. Activity  Do not use your arm to support your body weight until your health care provider says that you can.  Do not lift or hold anything with your arm until your health care provider approves.  Return to your normal activities as told by your health care provider. Ask your health care provider what activities are safe for you.  Do exercises as told by your health care provider. General instructions  Do not use any products that contain nicotine or tobacco, such as cigarettes, e-cigarettes, and chewing tobacco. These can delay healing after surgery. If you need help quitting, ask your health care provider.  Keep all follow-up visits as told by your health care provider. This is important. Contact a health care provider if:  You have a fever.  You have more redness, swelling, or pain around your incision.  You have more fluid or blood coming from your incision.  Your incision feels warm to the touch.  You have pus or a bad smell coming from your incision.  You have pain that gets worse or does not get better with medicine. Get help right away if:  You have severe pain.  You lose feeling in your arm or hand.  Your hand or fingers turn very pale or  blue. Summary  If you have a sling, wear it as told by your health care provider. Remove it only as told by your health care provider.  Change your dressing as told by your health care provider. Check the incision area every day for signs of infection.  If directed, put ice on your shoulder area 2-3 times a day.  Do not use your arm to lift anything or to support your body weight until your health care provider says that you can. This information is not intended to replace advice given to you by your health care provider. Make sure you discuss any questions you  have with your health care provider. Document Revised: 06/19/2018 Document Reviewed: 06/21/2018 Elsevier Patient Education  2020 Else[employer].  General Anesthesia, Adult, Care After This sheet gives you information about how to care for yourself after your procedure. Your health care provider may also give you more specific instructions. If you have problems or questions, contact your health care provider. What can I expect after the procedure? After the procedure, the following side effects are common:  Pain or discomfort at the IV site.  Nausea.  Vomiting.  Sore throat.  Trouble concentrating.  Feeling cold or chills.  Weak or tired.  Sleepiness and fatigue.  Soreness and body aches. These side effects can affect parts of the body that were not involved in surgery. Follow these instructions at home:  For at least 24 hours after the procedure:  Have a responsible adult stay with you. It is important to have someone help care for you until you are awake and alert.  Rest as needed.  Do not: ? Participate in activities in which you could fall or become injured. ? Drive. ? Use heavy machinery. ? Drink alcohol. ? Take sleeping pills or medicines that cause drowsiness. ? Make important decisions or sign legal documents. ? Take care of children on your own. Eating and drinking  Follow any instructions from your  health care provider about eating or drinking restrictions.  When you feel hungry, start by eating small amounts of foods that are soft and easy to digest (bland), such as toast. Gradually return to your regular diet.  Drink enough fluid to keep your urine pale yellow.  If you vomit, rehydrate by drinking water, juice, or clear broth. General instructions  If you have sleep apnea, surgery and certain medicines can increase your risk for breathing problems. Follow instructions from your health care provider about wearing your sleep device: ? Anytime you are sleeping, including during daytime naps. ? While taking prescription pain medicines, sleeping medicines, or medicines that make you drowsy.  Return to your normal activities as told by your health care provider. Ask your health care provider what activities are safe for you.  Take over-the-counter and prescription medicines only as told by your health care provider.  If you smoke, do not smoke without supervision.  Keep all follow-up visits as told by your health care provider. This is important. Contact a health care provider if:  You have nausea or vomiting that does not get better with medicine.  You cannot eat or drink without vomiting.  You have pain that does not get better with medicine.  You are unable to pass urine.  You develop a skin rash.  You have a fever.  You have redness around your IV site that gets worse. Get help right away if:  You have difficulty breathing.  You have chest pain.  You have blood in your urine or stool, or you vomit blood. Summary  After the procedure, it is common to have a sore throat or nausea. It is also common to feel tired.  Have a responsible adult stay with you for the first 24 hours after general anesthesia. It is important to have someone help care for you until you are awake and alert.  When you feel hungry, start by eating small amounts of foods that are soft and easy  to digest (bland), such as toast. Gradually return to your regular diet.  Drink enough fluid to keep your urine pale yellow.  Return to your normal activities as  told by your health care provider. Ask your health care provider what activities are safe for you. This information is not intended to replace advice given to you by your health care provider. Make sure you discuss any questions you have with your health care provider. Document Revised: 09/16/2017 Document Reviewed: 04/29/2017 Elsevier Patient Education  Orient. How to Use Chlorhexidine for Bathing Chlorhexidine gluconate (CHG) is a germ-killing (antiseptic) solution that is used to clean the skin. It can get rid of the bacteria that normally live on the skin and can keep them away for about 24 hours. To clean your skin with CHG, you may be given:  A CHG solution to use in the shower or as part of a sponge bath.  A prepackaged cloth that contains CHG. Cleaning your skin with CHG may help lower the risk for infection:  While you are staying in the intensive care unit of the hospital.  If you have a vascular access, such as a central line, to provide short-term or long-term access to your veins.  If you have a catheter to drain urine from your bladder.  If you are on a ventilator. A ventilator is a machine that helps you breathe by moving air in and out of your lungs.  After surgery. What are the risks? Risks of using CHG include:  A skin reaction.  Hearing loss, if CHG gets in your ears.  Eye injury, if CHG gets in your eyes and is not rinsed out.  The CHG product catching fire. Make sure that you avoid smoking and flames after applying CHG to your skin. Do not use CHG:  If you have a chlorhexidine allergy or have previously reacted to chlorhexidine.  On babies younger than 72 months of age. How to use CHG solution  Use CHG only as told by your health care provider, and follow the instructions on the  label.  Use the full amount of CHG as directed. Usually, this is one bottle. During a shower Follow these steps when using CHG solution during a shower (unless your health care provider gives you different instructions): 1. Start the shower. 2. Use your normal soap and shampoo to wash your face and hair. 3. Turn off the shower or move out of the shower stream. 4. Pour the CHG onto a clean washcloth. Do not use any type of brush or rough-edged sponge. 5. Starting at your neck, lather your body down to your toes. Make sure you follow these instructions: ? If you will be having surgery, pay special attention to the part of your body where you will be having surgery. Scrub this area for at least 1 minute. ? Do not use CHG on your head or face. If the solution gets into your ears or eyes, rinse them well with water. ? Avoid your genital area. ? Avoid any areas of skin that have broken skin, cuts, or scrapes. ? Scrub your back and under your arms. Make sure to wash skin folds. 6. Let the lather sit on your skin for 1-2 minutes or as long as told by your health care provider. 7. Thoroughly rinse your entire body in the shower. Make sure that all body creases and crevices are rinsed well. 8. Dry off with a clean towel. Do not put any substances on your body afterward--such as powder, lotion, or perfume--unless you are told to do so by your health care provider. Only use lotions that are recommended by the manufacturer. 9. Put on clean  clothes or pajamas. 10. If it is the night before your surgery, sleep in clean sheets.  During a sponge bath Follow these steps when using CHG solution during a sponge bath (unless your health care provider gives you different instructions): 1. Use your normal soap and shampoo to wash your face and hair. 2. Pour the CHG onto a clean washcloth. 3. Starting at your neck, lather your body down to your toes. Make sure you follow these instructions: ? If you will be having  surgery, pay special attention to the part of your body where you will be having surgery. Scrub this area for at least 1 minute. ? Do not use CHG on your head or face. If the solution gets into your ears or eyes, rinse them well with water. ? Avoid your genital area. ? Avoid any areas of skin that have broken skin, cuts, or scrapes. ? Scrub your back and under your arms. Make sure to wash skin folds. 4. Let the lather sit on your skin for 1-2 minutes or as long as told by your health care provider. 5. Using a different clean, wet washcloth, thoroughly rinse your entire body. Make sure that all body creases and crevices are rinsed well. 6. Dry off with a clean towel. Do not put any substances on your body afterward--such as powder, lotion, or perfume--unless you are told to do so by your health care provider. Only use lotions that are recommended by the manufacturer. 7. Put on clean clothes or pajamas. 8. If it is the night before your surgery, sleep in clean sheets. How to use CHG prepackaged cloths  Only use CHG cloths as told by your health care provider, and follow the instructions on the label.  Use the CHG cloth on clean, dry skin.  Do not use the CHG cloth on your head or face unless your health care provider tells you to.  When washing with the CHG cloth: ? Avoid your genital area. ? Avoid any areas of skin that have broken skin, cuts, or scrapes. Before surgery Follow these steps when using a CHG cloth to clean before surgery (unless your health care provider gives you different instructions): 1. Using the CHG cloth, vigorously scrub the part of your body where you will be having surgery. Scrub using a back-and-forth motion for 3 minutes. The area on your body should be completely wet with CHG when you are done scrubbing. 2. Do not rinse. Discard the cloth and let the area air-dry. Do not put any substances on the area afterward, such as powder, lotion, or perfume. 3. Put on clean  clothes or pajamas. 4. If it is the night before your surgery, sleep in clean sheets.  For general bathing Follow these steps when using CHG cloths for general bathing (unless your health care provider gives you different instructions). 1. Use a separate CHG cloth for each area of your body. Make sure you wash between any folds of skin and between your fingers and toes. Wash your body in the following order, switching to a new cloth after each step: ? The front of your neck, shoulders, and chest. ? Both of your arms, under your arms, and your hands. ? Your stomach and groin area, avoiding the genitals. ? Your right leg and foot. ? Your left leg and foot. ? The back of your neck, your back, and your buttocks. 2. Do not rinse. Discard the cloth and let the area air-dry. Do not put any substances on your  body afterward--such as powder, lotion, or perfume--unless you are told to do so by your health care provider. Only use lotions that are recommended by the manufacturer. 3. Put on clean clothes or pajamas. Contact a health care provider if:  Your skin gets irritated after scrubbing.  You have questions about using your solution or cloth. Get help right away if:  Your eyes become very red or swollen.  Your eyes itch badly.  Your skin itches badly and is red or swollen.  Your hearing changes.  You have trouble seeing.  You have swelling or tingling in your mouth or throat.  You have trouble breathing.  You swallow any chlorhexidine. Summary  Chlorhexidine gluconate (CHG) is a germ-killing (antiseptic) solution that is used to clean the skin. Cleaning your skin with CHG may help to lower your risk for infection.  You may be given CHG to use for bathing. It may be in a bottle or in a prepackaged cloth to use on your skin. Carefully follow your health care provider's instructions and the instructions on the product label.  Do not use CHG if you have a chlorhexidine  allergy.  Contact your health care provider if your skin gets irritated after scrubbing. This information is not intended to replace advice given to you by your health care provider. Make sure you discuss any questions you have with your health care provider. Document Revised: 11/30/2018 Document Reviewed: 08/11/2017 Elsevier Patient Education  2020 [employer].

## 2019-11-08 ENCOUNTER — Telehealth: Payer: Self-pay | Admitting: Radiology

## 2019-11-08 NOTE — Telephone Encounter (Signed)
[id number] is the pending auth number for shoulder surgery 11/15/19

## 2019-11-12 ENCOUNTER — Telehealth: Payer: Self-pay | Admitting: Orthopedic Surgery

## 2019-11-12 NOTE — Telephone Encounter (Signed)
Left detailed message to give facility name [facility] and tax id

## 2019-11-12 NOTE — Telephone Encounter (Signed)
Victorino Dike from [facility] called and left message stating she needs place of surgery for this patient.  She said as it stands, it shows in office.  If pt is going to facility, they need name of facility, address etc.  Authorization # for this is [id number]  Please call Victorino Dike at [phone] ext [phone]

## 2019-11-12 NOTE — Telephone Encounter (Signed)
Clydie Braun called back [phone] is not correct extension This is pain management for [facility] She can not help me. She called back to let me know she got message in error, and I have to start over  I have put a comment in the referral to give facility.

## 2019-11-13 ENCOUNTER — Other Ambulatory Visit (HOSPITAL_COMMUNITY)
Admission: RE | Admit: 2019-11-13 | Discharge: 2019-11-13 | Disposition: A | Discharge status: Home / Self Care | Payer: 59 | Source: Ambulatory Visit | Attending: Orthopedic Surgery | Admitting: Orthopedic Surgery

## 2019-11-13 ENCOUNTER — Encounter (HOSPITAL_COMMUNITY)
Admission: RE | Admit: 2019-11-13 | Discharge: 2019-11-13 | Disposition: A | Discharge status: Home / Self Care | Payer: 59 | Source: Ambulatory Visit | Attending: Orthopedic Surgery | Admitting: Orthopedic Surgery

## 2019-11-13 ENCOUNTER — Other Ambulatory Visit: Payer: Self-pay

## 2019-11-13 ENCOUNTER — Other Ambulatory Visit: Payer: Self-pay | Admitting: Orthopedic Surgery

## 2019-11-13 ENCOUNTER — Telehealth: Payer: Self-pay | Admitting: Orthopedic Surgery

## 2019-11-13 ENCOUNTER — Encounter (HOSPITAL_COMMUNITY): Payer: Self-pay

## 2019-11-13 DIAGNOSIS — S46011A Strain of muscle(s) and tendon(s) of the rotator cuff of right shoulder, initial encounter: Secondary | ICD-10-CM

## 2019-11-13 DIAGNOSIS — Z20822 Contact with and (suspected) exposure to covid-19: Secondary | ICD-10-CM | POA: Diagnosis not present

## 2019-11-13 DIAGNOSIS — Z01812 Encounter for preprocedural laboratory examination: Secondary | ICD-10-CM | POA: Insufficient documentation

## 2019-11-13 HISTORY — DX: Other complications of anesthesia, initial encounter: T88.59XA

## 2019-11-13 LAB — CBC WITH DIFFERENTIAL/PLATELET
Abs Immature Granulocytes: 0.03 10*3/uL (ref 0.00–0.07)
Basophils Absolute: 0.1 10*3/uL (ref 0.0–0.1)
Basophils Relative: 1 %
Eosinophils Absolute: 0.2 10*3/uL (ref 0.0–0.5)
Eosinophils Relative: 2 %
HCT: 40.8 % (ref 36.0–46.0)
Hemoglobin: 13.2 g/dL (ref 12.0–15.0)
Immature Granulocytes: 0 %
Lymphocytes Relative: 37 %
Lymphs Abs: 3.8 10*3/uL (ref 0.7–4.0)
MCH: 29.2 pg (ref 26.0–34.0)
MCHC: 32.4 g/dL (ref 30.0–36.0)
MCV: 90.3 fL (ref 80.0–100.0)
Monocytes Absolute: 0.5 10*3/uL (ref 0.1–1.0)
Monocytes Relative: 4 %
Neutro Abs: 5.7 10*3/uL (ref 1.7–7.7)
Neutrophils Relative %: 56 %
Platelets: 296 10*3/uL (ref 150–400)
RBC: 4.52 MIL/uL (ref 3.87–5.11)
RDW: 12.8 % (ref 11.5–15.5)
WBC: 10.2 10*3/uL (ref 4.0–10.5)
nRBC: 0 % (ref 0.0–0.2)

## 2019-11-13 LAB — BASIC METABOLIC PANEL
Anion gap: 10 (ref 5–15)
BUN: 17 mg/dL (ref 6–20)
CO2: 25 mmol/L (ref 22–32)
Calcium: 9.3 mg/dL (ref 8.9–10.3)
Chloride: 107 mmol/L (ref 98–111)
Creatinine, Ser: 0.71 mg/dL (ref 0.44–1.00)
GFR calc Af Amer: >60 mL/min (ref >60)
GFR calc non Af Amer: >60 mL/min (ref >60)
Glucose, Bld: 96 mg/dL (ref 70–99)
Potassium: 4.2 mmol/L (ref 3.5–5.1)
Sodium: 142 mmol/L (ref 135–145)

## 2019-11-13 LAB — SARS CORONAVIRUS 2 (TAT 6-24 HRS): SARS Coronavirus 2: NEGATIVE

## 2019-11-13 NOTE — Telephone Encounter (Signed)
I have gotten approval  / put in all the proper places so everyone can see this.

## 2019-11-13 NOTE — Telephone Encounter (Signed)
Call received from [facility] at [facility]#[phone], ext [phone] - relays that patient's [employer] requires pre-authorization for surgery scheduled 11/15/19. States online he sees that one has been started "for the doctor, and not seeing one started for the hospital."  Please advise.

## 2019-11-14 NOTE — H&P (Signed)
PREOP CONSULT/REFERRAL INTRA-OFFICE FROM DR Tyrone Apple         Chief Complaint  Patient presents with  . Shoulder Pain      Right shoulder pain, consult from Dr. Luna Glasgow.      This is a 59 year old female who presents at Dr. Brooke Bonito request for possible surgery.  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   She has decreased range of motion persistent pain in the right anterior shoulder     Review of Systems  Constitutional: Negative for fever.  Respiratory:       Smoker without shortness of breath  Cardiovascular: Negative for chest pain.  Neurological: Negative for tingling.            Past Medical History:  Diagnosis Date  . Arthritis    . Asthma    . Cataract    . Depression    . Joint pain    . Nausea    . Smoker             Past Surgical History:  Procedure Laterality Date  . ABDOMINAL HYSTERECTOMY   2000  . APPENDECTOMY   1981  . TONSILLECTOMY        59 years old           Family History  Adopted: Yes  Problem Relation Age of Onset  . Diabetes Son      Social History         Tobacco Use  . Smoking status: Current Every Day Smoker      Packs/day: 1.00      Years: 34.00      Pack years: 34.00      Types: Cigarettes  . Smokeless tobacco: Never Used  . Tobacco comment: Failed Chantix in past  Substance Use Topics  . Alcohol use: Not Currently      Alcohol/week: 2.0 standard drinks      Types: 2 Glasses of wine per week      Comment: glass of wine once a month  . Drug use: No      No Known Allergies     Active Medications  No outpatient medications have been marked as taking  for the 10/30/19 encounter (Office Visit) with Carole Civil, MD.        BP 135/74   Pulse 84   Temp 98.1 F (36.7 C)   Ht 5\' 7"  (1.702 m)   Wt 190 lb (86.2 kg)   BMI 29.76 kg/m    Physical Exam She is well-developed well-nourished grooming and hygiene are normal she is oriented x3 mood and affect are normal     Ortho Exam    No gait disturbances are noted.  She has tenderness over the anterolateral right shoulder joint and off the acromion her range of motion is 30 degrees external rotation and 50 degrees of abduction   I did a rotator cuff lidocaine injection test in the subacromial space and repeated the examination at which time she externally rotated fully I was able to elevate her arm up to 150 degrees at which she did have pain  from 120 through 150 degrees with weakness in the rotator cuff primarily supraspinatus.  Shoulder was stable   Neurovascular exam was intact and skin was normal   MEDICAL DECISION SECTION  xrays ordered? no   My independent reading of xrays: Plain films look normal   MRI reveals a torn rotator cuff is a small tear with minimal retraction primarily involving the supraspinatus tendon         Encounter Diagnoses  Name Primary?  . Traumatic complete tear of right rotator cuff, initial encounter Yes  . Nicotine dependence, cigarettes, uncomplicated          PLAN:    Surgical procedure planned: Arthroscopic repair right shoulder rotator cuff     The procedure has been fully reviewed with the patient; The risks and benefits of surgery have been discussed and explained and understood. Alternative treatment has also been reviewed, questions were encouraged and answered. The postoperative plan is also been reviewed.   Nonsurgical treatment as described in the history and physical section was attempted and unsuccessful and the patient has agreed to proceed with surgical intervention to improve their situation.   No orders of the defined types  were placed in this encounter.     Fuller Canada, MD 10/30/2019 3:39 PM

## 2019-11-14 NOTE — Telephone Encounter (Signed)
I received a call from F[facility] [phone] ext [phone].  He relays that he is still needing the authorization on this patient.  I spoke to Amy Littrell who states that this has all been taken care of at this time.

## 2019-11-14 NOTE — Telephone Encounter (Signed)
Recreated the referral [id number] is pending auth number

## 2019-11-15 ENCOUNTER — Other Ambulatory Visit: Payer: Self-pay

## 2019-11-15 ENCOUNTER — Encounter (HOSPITAL_COMMUNITY)
Admission: RE | Disposition: A | Discharge status: Home / Self Care | Payer: Self-pay | Source: Home / Self Care | Attending: Orthopedic Surgery

## 2019-11-15 ENCOUNTER — Ambulatory Visit (HOSPITAL_COMMUNITY): Payer: 59 | Admitting: Anesthesiology

## 2019-11-15 ENCOUNTER — Encounter (HOSPITAL_COMMUNITY): Payer: Self-pay | Admitting: Orthopedic Surgery

## 2019-11-15 ENCOUNTER — Ambulatory Visit (HOSPITAL_COMMUNITY)
Admission: RE | Admit: 2019-11-15 | Discharge: 2019-11-15 | Disposition: A | Discharge status: Home / Self Care | Payer: 59 | Attending: Orthopedic Surgery | Admitting: Orthopedic Surgery

## 2019-11-15 DIAGNOSIS — Z833 Family history of diabetes mellitus: Secondary | ICD-10-CM | POA: Insufficient documentation

## 2019-11-15 DIAGNOSIS — M1990 Unspecified osteoarthritis, unspecified site: Secondary | ICD-10-CM | POA: Insufficient documentation

## 2019-11-15 DIAGNOSIS — F449 Dissociative and conversion disorder, unspecified: Secondary | ICD-10-CM | POA: Diagnosis not present

## 2019-11-15 DIAGNOSIS — F329 Major depressive disorder, single episode, unspecified: Secondary | ICD-10-CM | POA: Diagnosis not present

## 2019-11-15 DIAGNOSIS — J45909 Unspecified asthma, uncomplicated: Secondary | ICD-10-CM | POA: Insufficient documentation

## 2019-11-15 DIAGNOSIS — K219 Gastro-esophageal reflux disease without esophagitis: Secondary | ICD-10-CM | POA: Diagnosis not present

## 2019-11-15 DIAGNOSIS — M75121 Complete rotator cuff tear or rupture of right shoulder, not specified as traumatic: Secondary | ICD-10-CM | POA: Diagnosis not present

## 2019-11-15 DIAGNOSIS — F17210 Nicotine dependence, cigarettes, uncomplicated: Secondary | ICD-10-CM | POA: Insufficient documentation

## 2019-11-15 DIAGNOSIS — M75111 Incomplete rotator cuff tear or rupture of right shoulder, not specified as traumatic: Secondary | ICD-10-CM

## 2019-11-15 HISTORY — PX: SHOULDER ARTHROSCOPY WITH ROTATOR CUFF REPAIR: SHX5685

## 2019-11-15 SURGERY — ARTHROSCOPY, SHOULDER, WITH ROTATOR CUFF REPAIR
Anesthesia: General | Site: Shoulder | Laterality: Right

## 2019-11-15 MED ORDER — EPINEPHRINE PF 1 MG/ML IJ SOLN
INTRAMUSCULAR | Status: AC
  Filled 2019-11-15: qty 4

## 2019-11-15 MED ORDER — TIZANIDINE HCL 4 MG PO TABS: 4.0000 mg | ORAL_TABLET | Freq: Every day | ORAL | 1 refills | Status: DC

## 2019-11-15 MED ORDER — EPINEPHRINE PF 1 MG/ML IJ SOLN
INTRAMUSCULAR | Status: AC
  Filled 2019-11-15: qty 6

## 2019-11-15 MED ORDER — IBUPROFEN 800 MG PO TABS: 800.0000 mg | ORAL_TABLET | Freq: Three times a day (TID) | ORAL | 1 refills | Status: DC | PRN

## 2019-11-15 MED ORDER — MIDAZOLAM HCL 2 MG/2ML IJ SOLN
INTRAMUSCULAR | Status: AC
  Filled 2019-11-15: qty 2

## 2019-11-15 MED ORDER — OXYCODONE-ACETAMINOPHEN 5-325 MG PO TABS: 1.0000 | ORAL_TABLET | Freq: Four times a day (QID) | ORAL | 0 refills | Status: DC | PRN

## 2019-11-15 MED ORDER — ONDANSETRON HCL 4 MG PO TABS: 4.0000 mg | ORAL_TABLET | Freq: Every day | ORAL | 1 refills | Status: DC | PRN

## 2019-11-15 MED ORDER — ONDANSETRON HCL 4 MG/2ML IJ SOLN
INTRAMUSCULAR | Status: DC | PRN
  Administered 2019-11-15: 4 mg via INTRAVENOUS

## 2019-11-15 MED ORDER — FENTANYL CITRATE (PF) 100 MCG/2ML IJ SOLN
INTRAMUSCULAR | Status: DC | PRN
  Administered 2019-11-15 (×4): 50 ug via INTRAVENOUS
  Administered 2019-11-15: 25 ug via INTRAVENOUS
  Administered 2019-11-15: 50 ug via INTRAVENOUS

## 2019-11-15 MED ORDER — BUPIVACAINE-EPINEPHRINE 0.5% -1:200000 IJ SOLN
INTRAMUSCULAR | Status: DC | PRN
  Administered 2019-11-15: 60 mL

## 2019-11-15 MED ORDER — ROCURONIUM BROMIDE 10 MG/ML (PF) SYRINGE
PREFILLED_SYRINGE | INTRAVENOUS | Status: AC
  Filled 2019-11-15: qty 10

## 2019-11-15 MED ORDER — PROPOFOL 10 MG/ML IV BOLUS
INTRAVENOUS | Status: AC
  Filled 2019-11-15: qty 20

## 2019-11-15 MED ORDER — LIDOCAINE 2% (20 MG/ML) 5 ML SYRINGE
INTRAMUSCULAR | Status: AC
  Filled 2019-11-15: qty 5

## 2019-11-15 MED ORDER — BUPIVACAINE-EPINEPHRINE (PF) 0.5% -1:200000 IJ SOLN
INTRAMUSCULAR | Status: AC
  Filled 2019-11-15: qty 60

## 2019-11-15 MED ORDER — MIDAZOLAM HCL 2 MG/2ML IJ SOLN: 0.5000 mg | Freq: Once | INTRAMUSCULAR | Status: DC | PRN

## 2019-11-15 MED ORDER — SUGAMMADEX SODIUM 200 MG/2ML IV SOLN
INTRAVENOUS | Status: DC | PRN
  Administered 2019-11-15: 150 mg via INTRAVENOUS

## 2019-11-15 MED ORDER — PROPOFOL 10 MG/ML IV BOLUS
INTRAVENOUS | Status: DC | PRN
  Administered 2019-11-15: 200 mg via INTRAVENOUS

## 2019-11-15 MED ORDER — LACTATED RINGERS IV SOLN: INTRAVENOUS | Status: DC | PRN

## 2019-11-15 MED ORDER — CEFAZOLIN SODIUM-DEXTROSE 2-4 GM/100ML-% IV SOLN
INTRAVENOUS | Status: AC
  Filled 2019-11-15: qty 100

## 2019-11-15 MED ORDER — TIZANIDINE HCL 4 MG PO TABS: 4.0000 mg | ORAL_TABLET | Freq: Three times a day (TID) | ORAL | 1 refills | Status: DC

## 2019-11-15 MED ORDER — SUCCINYLCHOLINE CHLORIDE 200 MG/10ML IV SOSY
PREFILLED_SYRINGE | INTRAVENOUS | Status: AC
  Filled 2019-11-15: qty 10

## 2019-11-15 MED ORDER — LACTATED RINGERS IV SOLN: INTRAVENOUS | Status: DC

## 2019-11-15 MED ORDER — CELECOXIB 100 MG PO CAPS
200.0000 mg | ORAL_CAPSULE | Freq: Every day | ORAL | Status: DC
  Administered 2019-11-15: 200 mg via ORAL
  Filled 2019-11-15: qty 2

## 2019-11-15 MED ORDER — FENTANYL CITRATE (PF) 250 MCG/5ML IJ SOLN
INTRAMUSCULAR | Status: AC
  Filled 2019-11-15: qty 5

## 2019-11-15 MED ORDER — SODIUM CHLORIDE 0.9 % IR SOLN
Status: DC | PRN
  Administered 2019-11-15 (×3): 3000 mL

## 2019-11-15 MED ORDER — ROCURONIUM 10MG/ML (10ML) SYRINGE FOR MEDFUSION PUMP - OPTIME
INTRAVENOUS | Status: DC | PRN
  Administered 2019-11-15: 40 mg via INTRAVENOUS
  Administered 2019-11-15: 10 mg via INTRAVENOUS

## 2019-11-15 MED ORDER — SODIUM CHLORIDE 0.9 % IR SOLN
Status: DC | PRN
  Administered 2019-11-15: 1000 mL

## 2019-11-15 MED ORDER — SUCCINYLCHOLINE 20MG/ML (10ML) SYRINGE FOR MEDFUSION PUMP - OPTIME
INTRAMUSCULAR | Status: DC | PRN
  Administered 2019-11-15: 120 mg via INTRAVENOUS

## 2019-11-15 MED ORDER — CEFAZOLIN SODIUM-DEXTROSE 2-4 GM/100ML-% IV SOLN
2.0000 g | INTRAVENOUS | Status: AC
  Administered 2019-11-15: 2 g via INTRAVENOUS

## 2019-11-15 MED ORDER — HYDROCODONE-ACETAMINOPHEN 7.5-325 MG PO TABS: 1.0000 | ORAL_TABLET | Freq: Once | ORAL | Status: DC | PRN

## 2019-11-15 MED ORDER — HYDROMORPHONE HCL 1 MG/ML IJ SOLN
0.2500 mg | INTRAMUSCULAR | Status: DC | PRN
  Administered 2019-11-15 (×3): 0.5 mg via INTRAVENOUS
  Filled 2019-11-15 (×3): qty 0.5

## 2019-11-15 MED ORDER — PROMETHAZINE HCL 25 MG/ML IJ SOLN: 6.2500 mg | INTRAMUSCULAR | Status: DC | PRN

## 2019-11-15 MED ORDER — ONDANSETRON HCL 4 MG/2ML IJ SOLN
INTRAMUSCULAR | Status: AC
  Filled 2019-11-15: qty 2

## 2019-11-15 MED ORDER — OXYCODONE HCL 5 MG PO TABS
5.0000 mg | ORAL_TABLET | ORAL | Status: DC
  Administered 2019-11-15: 5 mg via ORAL
  Filled 2019-11-15: qty 1

## 2019-11-15 MED ORDER — MIDAZOLAM HCL 5 MG/5ML IJ SOLN
INTRAMUSCULAR | Status: DC | PRN
  Administered 2019-11-15: 2 mg via INTRAVENOUS

## 2019-11-15 MED ORDER — METHOCARBAMOL 1000 MG/10ML IJ SOLN
500.0000 mg | Freq: Four times a day (QID) | INTRAVENOUS | Status: DC
  Administered 2019-11-15: 500 mg via INTRAVENOUS
  Filled 2019-11-15: qty 5

## 2019-11-15 MED ORDER — CHLORHEXIDINE GLUCONATE 4 % EX LIQD: 60.0000 mL | Freq: Once | CUTANEOUS | Status: DC

## 2019-11-15 MED ORDER — LIDOCAINE HCL (CARDIAC) PF 50 MG/5ML IV SOSY
PREFILLED_SYRINGE | INTRAVENOUS | Status: DC | PRN
  Administered 2019-11-15: 60 mg via INTRAVENOUS

## 2019-11-15 MED ORDER — FENTANYL CITRATE (PF) 100 MCG/2ML IJ SOLN
INTRAMUSCULAR | Status: AC
  Filled 2019-11-15: qty 2

## 2019-11-15 MED ORDER — ONDANSETRON HCL 4 MG/2ML IJ SOLN
4.0000 mg | Freq: Four times a day (QID) | INTRAMUSCULAR | Status: DC
  Filled 2019-11-15: qty 2

## 2019-11-15 SURGICAL SUPPLY — 79 items
ANCHOR SUT BIO SW 4.75X19.1 (Anchor) ×3 IMPLANT
ANCHOR SUT CORKSCREW 5X15.5 (Anchor) ×2 IMPLANT
ANCHOR SUT CORKSCREW 5X15.5MM (Anchor) ×1 IMPLANT
BENZOIN TINCTURE PRP APPL 2/3 (GAUZE/BANDAGES/DRESSINGS) ×3 IMPLANT
BIT DRILL 2.0X128 (BIT) ×2 IMPLANT
BIT DRILL 2.0X128MM (BIT) ×1
BLADE AGGRESSIVE PLUS 4.0 (BLADE) ×3 IMPLANT
BLADE HEX COATED 2.75 (ELECTRODE) ×3 IMPLANT
BLADE SURG 15 STRL LF DISP TIS (BLADE) ×1 IMPLANT
BLADE SURG 15 STRL SS (BLADE) ×2
BLADE SURG SZ10 CARB STEEL (BLADE) ×3 IMPLANT
BLADE SURG SZ11 CARB STEEL (BLADE) ×3 IMPLANT
BNDG COHESIVE 4X5 TAN STRL (GAUZE/BANDAGES/DRESSINGS) ×3 IMPLANT
CANNULA DRILOCK 5.0MMX75MM (CANNULA) ×1
CANNULA DRILOCK 5.0X75 (CANNULA) ×2 IMPLANT
CANNULA DRILOCK 6.5MMX75MM (CANNULA) ×1
CANNULA DRILOCK 6.5X75 (CANNULA) ×2 IMPLANT
CHLORAPREP W/TINT 26 (MISCELLANEOUS) ×3 IMPLANT
CLOSURE STERI-STRIP 1/2X4 (GAUZE/BANDAGES/DRESSINGS) ×1
CLOSURE WOUND 1/2 X4 (GAUZE/BANDAGES/DRESSINGS) ×1
CLOTH BEACON ORANGE TIMEOUT ST (SAFETY) ×3 IMPLANT
CLSR STERI-STRIP ANTIMIC 1/2X4 (GAUZE/BANDAGES/DRESSINGS) ×2 IMPLANT
COVER LIGHT HANDLE STERIS (MISCELLANEOUS) ×6 IMPLANT
COVER WAND RF STERILE (DRAPES) ×6 IMPLANT
DECANTER SPIKE VIAL GLASS SM (MISCELLANEOUS) ×6 IMPLANT
DRAPE HALF SHEET 40X57 (DRAPES) ×3 IMPLANT
DRAPE SHOULDER BEACH CHAIR (DRAPES) ×3 IMPLANT
DRAPE U-SHAPE 47X51 STRL (DRAPES) ×3 IMPLANT
DRESSING MEPILEX BORDER 6X8 (GAUZE/BANDAGES/DRESSINGS) ×1 IMPLANT
DRSG MEPILEX BORDER 6X8 (GAUZE/BANDAGES/DRESSINGS) ×3
DRSG TEGADERM 4X4.75 (GAUZE/BANDAGES/DRESSINGS) ×3 IMPLANT
ELECT REM PT RETURN 9FT ADLT (ELECTROSURGICAL) ×3
ELECTRODE REM PT RTRN 9FT ADLT (ELECTROSURGICAL) ×1 IMPLANT
GAUZE 4X4 16PLY RFD (DISPOSABLE) ×3 IMPLANT
GLOVE BIO SURGEON STRL SZ7 (GLOVE) ×9 IMPLANT
GLOVE BIOGEL PI IND STRL 7.0 (GLOVE) ×3 IMPLANT
GLOVE BIOGEL PI INDICATOR 7.0 (GLOVE) ×6
GLOVE SKINSENSE NS SZ8.0 LF (GLOVE) ×2
GLOVE SKINSENSE STRL SZ8.0 LF (GLOVE) ×1 IMPLANT
GLOVE SS N UNI LF 8.5 STRL (GLOVE) ×3 IMPLANT
GOWN STRL REUS W/TWL LRG LVL3 (GOWN DISPOSABLE) ×6 IMPLANT
GOWN STRL REUS W/TWL XL LVL3 (GOWN DISPOSABLE) ×3 IMPLANT
INST SET MINOR BONE (KITS) ×3 IMPLANT
IV NS IRRIG 3000ML ARTHROMATIC (IV SOLUTION) ×9 IMPLANT
KIT BLADEGUARD II DBL (SET/KITS/TRAYS/PACK) ×3 IMPLANT
KIT POSITION SHOULDER SCHLEI (MISCELLANEOUS) ×3 IMPLANT
KIT TURNOVER KIT A (KITS) ×3 IMPLANT
MANIFOLD NEPTUNE II (INSTRUMENTS) ×3 IMPLANT
MARKER SKIN DUAL TIP RULER LAB (MISCELLANEOUS) ×3 IMPLANT
NEEDLE HYPO 18GX1.5 BLUNT FILL (NEEDLE) ×3 IMPLANT
NEEDLE HYPO 21X1.5 SAFETY (NEEDLE) ×3 IMPLANT
NEEDLE MAYO 6 CRC TAPER PT (NEEDLE) IMPLANT
NEEDLE SCORPION (NEEDLE) IMPLANT
NEEDLE SCORPION MULTI FIRE (NEEDLE) ×3 IMPLANT
NEEDLE SPNL 18GX3.5 QUINCKE PK (NEEDLE) ×6 IMPLANT
NS IRRIG 1000ML POUR BTL (IV SOLUTION) ×3 IMPLANT
PACK TOTAL JOINT (CUSTOM PROCEDURE TRAY) ×3 IMPLANT
PAD ARMBOARD 7.5X6 YLW CONV (MISCELLANEOUS) ×3 IMPLANT
PENCIL HANDSWITCHING (ELECTRODE) ×3 IMPLANT
PROBE BIPOLAR ATHRO 135MM 90D (MISCELLANEOUS) ×3 IMPLANT
SET ARTHROSCOPY INST (INSTRUMENTS) ×3 IMPLANT
SET BASIN LINEN APH (SET/KITS/TRAYS/PACK) ×3 IMPLANT
SET TUBE SUCT SHAVER OUTFL 24K (TUBING) ×3 IMPLANT
SLING ARM IMMOBILIZER MED (SOFTGOODS) ×3 IMPLANT
SPONGE GAUZE 2X2 8PLY STER LF (GAUZE/BANDAGES/DRESSINGS) ×1
SPONGE GAUZE 2X2 8PLY STRL LF (GAUZE/BANDAGES/DRESSINGS) ×2 IMPLANT
STOCKINETTE IMPERVIOUS LG (DRAPES) ×3 IMPLANT
STRIP CLOSURE SKIN 1/2X4 (GAUZE/BANDAGES/DRESSINGS) ×2 IMPLANT
SUT ETHIBOND NAB OS 4 #2 30IN (SUTURE) ×3 IMPLANT
SUT ETHILON 3 0 FSL (SUTURE) ×3 IMPLANT
SUT MNCRL 0 VIOLET CTX 36 (SUTURE) ×1 IMPLANT
SUT MON AB 2-0 CT1 36 (SUTURE) ×3 IMPLANT
SUT MONOCRYL 0 CTX 36 (SUTURE) ×2
SUT PDS AB CT VIOLET #0 27IN (SUTURE) ×3 IMPLANT
SYR 30ML LL (SYRINGE) ×6 IMPLANT
SYR 5ML LL (SYRINGE) ×3 IMPLANT
TOWEL OR 17X26 4PK STRL BLUE (TOWEL DISPOSABLE) ×3 IMPLANT
TUBING ARTHRO INFLOW-ONLY STRL (TUBING) ×3 IMPLANT
YANKAUER SUCT 12FT TUBE ARGYLE (SUCTIONS) ×3 IMPLANT

## 2019-11-15 NOTE — Anesthesia Procedure Notes (Signed)
Procedure Name: Intubation Date/Time: 11/15/2019 8:39 AM Performed by: Ollen Bowl, CRNA Pre-anesthesia Checklist: Patient identified, Patient being monitored, Timeout performed, Emergency Drugs available and Suction available Patient Re-evaluated:Patient Re-evaluated prior to induction Oxygen Delivery Method: Circle system utilized Preoxygenation: Pre-oxygenation with 100% oxygen Induction Type: IV induction Ventilation: Mask ventilation without difficulty Laryngoscope Size: Mac and 3 Grade View: Grade I Tube type: Oral Tube size: 7.0 mm Number of attempts: 1 Airway Equipment and Method: Stylet Placement Confirmation: ETT inserted through vocal cords under direct vision,  positive ETCO2 and breath sounds checked- equal and bilateral Secured at: 21 cm Tube secured with: Tape Dental Injury: Teeth and Oropharynx as per pre-operative assessment

## 2019-11-15 NOTE — Anesthesia Postprocedure Evaluation (Signed)
Anesthesia Post Note  Patient: Jacqueline [name]  Procedure(s) Performed: SHOULDER ARTHROSCOPY WITH ROTATOR CUFF REPAIR (Right Shoulder)  Patient location during evaluation: PACU Anesthesia Type: General Level of consciousness: awake and alert and oriented Pain management: satisfactory to patient Vital Signs Assessment: post-procedure vital signs reviewed and stable Respiratory status: spontaneous breathing Cardiovascular status: blood pressure returned to baseline and stable Postop Assessment: no apparent nausea or vomiting Anesthetic complications: no     Last Vitals:  Vitals:   11/15/19 1100 11/15/19 1115  BP: (!) 146/84 135/66  Pulse: 93 85  Resp: (!) 24 (!) 26  Temp:    SpO2: 97% 97%    Last Pain:  Vitals:   11/15/19 1100  TempSrc:   PainSc: 10-Worst pain ever                 Cherilyn Sautter

## 2019-11-15 NOTE — Anesthesia Preprocedure Evaluation (Addendum)
Anesthesia Evaluation  Patient identified by MRN, date of birth, ID band Patient awake  General Assessment Comment:Reports h/o postop fever  Denies high fever or SOB after surgery in past   Reviewed: Allergy & Precautions, NPO status , Patient's Chart, lab work & pertinent test results  History of Anesthesia Complications (+) history of anesthetic complications  Airway Mallampati: I  TM Distance: >3 FB Neck ROM: Full    Dental no notable dental hx. (+) Edentulous Upper, Missing   Pulmonary shortness of breath and with exertion, asthma , COPD,  COPD inhaler, Current Smoker,  One PPD smoker  Smoked this AM Reports slight DOE States rarely uses inhaler - used this morning - per instructions -state prior to that, it had been about a year    Pulmonary exam normal breath sounds clear to auscultation       Cardiovascular Exercise Tolerance: Good + DOE  Normal cardiovascular examI Rhythm:Regular Rate:Normal     Neuro/Psych PSYCHIATRIC DISORDERS Depression negative neurological ROS     GI/Hepatic Neg liver ROS, GERD  Medicated and Controlled,  Endo/Other  negative endocrine ROS  Renal/GU negative Renal ROS  negative genitourinary   Musculoskeletal  (+) Arthritis , Osteoarthritis,    Abdominal   Peds negative pediatric ROS (+)  Hematology negative hematology ROS (+)   Anesthesia Other Findings   Reproductive/Obstetrics negative OB ROS                            Anesthesia Physical Anesthesia Plan  ASA: II  Anesthesia Plan: General   Post-op Pain Management:    Induction: Intravenous  PONV Risk Score and Plan: 2 and Ondansetron and Dexamethasone  Airway Management Planned: Oral ETT  Additional Equipment:   Intra-op Plan:   Post-operative Plan: Extubation in OR  Informed Consent: I have reviewed the patients History and Physical, chart, labs and discussed the procedure including  the risks, benefits and alternatives for the proposed anesthesia with the patient or authorized representative who has indicated his/her understanding and acceptance.     Dental advisory given  Plan Discussed with: CRNA  Anesthesia Plan Comments: (Plan Full PPE use Plan GETA D/W PT -WTP with same after Q&A)        Anesthesia Quick Evaluation

## 2019-11-15 NOTE — Brief Op Note (Signed)
11/15/2019  10:36 AM  PATIENT:  Jacqueline [name]  59 y.o. female  PRE-OPERATIVE DIAGNOSIS:  right shoulder rotator cuff tear  POST-OPERATIVE DIAGNOSIS:  * No post-op diagnosis entered *  PROCEDURE:  Procedure(s): SHOULDER ARTHROSCOPY WITH ROTATOR CUFF REPAIR (Right)   PARTIAL THICKNESS ANTERIOR ROTATOR CUFF TEAR  BICEPS NORMAL  MILD CHONDRAL THINNING GLENOID  LABRUM MILD DEGENERATION     SURGEON:  Surgeon(s) and Role:    Vickki Hearing, MD - Primary  Implants: (1) corkscrew 5.5 arthrex anchor medial row and (1) swivel lock lateral row   PHYSICIAN ASSISTANT:   ASSISTANTS: cynthia wrenn   ANESTHESIA:   general  EBL:  5 cc   BLOOD ADMINISTERED:none  DRAINS: none   LOCAL MEDICATIONS USED:  BUPIVICAINE   SPECIMEN:  No Specimen  DISPOSITION OF SPECIMEN:  N/A  COUNTS:  YES  TOURNIQUET:  * No tourniquets in log *  DICTATION: .Dragon Dictation  PLAN OF CARE: Discharge to home after PACU  PATIENT DISPOSITION:  PACU - hemodynamically stable.   Delay start of Pharmacological VTE agent (>24hrs) due to surgical blood loss or risk of bleeding: not applicable

## 2019-11-15 NOTE — Interval H&P Note (Signed)
History and Physical Interval Note:  11/15/2019 7:57 AM  Jacqueline [name]  has presented today for surgery, with the diagnosis of right shoulder rotator cuff tear.  The various methods of treatment have been discussed with the patient and family. After consideration of risks, benefits and other options for treatment, the patient has consented to  Procedure(s): SHOULDER ARTHROSCOPY WITH ROTATOR CUFF REPAIR (Right) as a surgical intervention.  The patient's history has been reviewed, patient examined, no change in status, stable for surgery.  I have reviewed the patient's chart and labs.  Questions were answered to the patient's satisfaction.     Fuller Canada

## 2019-11-15 NOTE — Transfer of Care (Signed)
Immediate Anesthesia Transfer of Care Note  Patient: Jacqueline [name]  Procedure(s) Performed: SHOULDER ARTHROSCOPY WITH ROTATOR CUFF REPAIR (Right Shoulder)  Patient Location: PACU  Anesthesia Type:General  Level of Consciousness: awake  Airway & Oxygen Therapy: Patient Spontanous Breathing  Post-op Assessment: Report given to RN  Post vital signs: Reviewed and stable  Last Vitals:  Vitals Value Taken Time  BP    Temp    Pulse 89 11/15/19 1047  Resp 19 11/15/19 1047  SpO2 100 % 11/15/19 1047  Vitals shown include unvalidated device data.  Last Pain:  Vitals:   11/15/19 0735  TempSrc: Oral  PainSc: 0-No pain      Patients Stated Pain Goal: 7 (11/15/19 0735)  Complications: No apparent anesthesia complications

## 2019-11-15 NOTE — Op Note (Signed)
11/15/2019  10:36 AM  PATIENT:  Jacqueline [name]  59 y.o. female  PRE-OPERATIVE DIAGNOSIS:  right shoulder rotator cuff tear  POST-OPERATIVE DIAGNOSIS:  * No post-op diagnosis entered *  PROCEDURE:  Procedure(s): SHOULDER ARTHROSCOPY WITH ROTATOR CUFF REPAIR (Right)   PARTIAL THICKNESS ANTERIOR ROTATOR CUFF TEAR  BICEPS NORMAL  MILD CHONDRAL THINNING GLENOID  LABRUM MILD DEGENERATION     SURGEON:  Surgeon(s) and Role:    Vickki Hearing, MD - Primary  Implants: (1) corkscrew 5.5 arthrex anchor medial row and (1) swivel lock lateral row   PHYSICIAN ASSISTANT:   ASSISTANTS: cynthia wrenn   ANESTHESIA:   general  EBL:  5 cc   BLOOD ADMINISTERED:none  DRAINS: none   LOCAL MEDICATIONS USED:  BUPIVICAINE   SPECIMEN:  No Specimen  DISPOSITION OF SPECIMEN:  N/A  COUNTS:  YES  TOURNIQUET:  * No tourniquets in log *  DICTATION: .Dragon Dictation  PLAN OF CARE: Discharge to home after PACU  PATIENT DISPOSITION:  PACU - hemodynamically stable.   Delay start of Pharmacological VTE agent (>24hrs) due to surgical blood loss or risk of bleeding: not applicable  The patient was seen in preop and the surgical site was confirmed.  Thorough chart review was completed including image review  She was taken to the operating room placed in the supine position she was intubated and then an exam under anesthesia was performed the shoulder had no range of motion deficits  She was then placed in a modified beachchair position with appropriate padding and then the right arm was prepped and draped sterilely  Timeout was executed and completed  Diagnostic arthroscopy was performed.  Posterior portal was established scope was placed into the joint.  Biceps tendon was intact including the biceps anchor there was mild chondral thinning on the glenoid side humeral side look normal mild degenerative fraying of the labrum.  Axillary pouch was free of any loose bodies  Rotator cuff  tear was found just posterior to the biceps tendon.  This was a partial thickness tear.  The anterior portal was established with a spinal needle and a cannula was placed into the joint.  A spinal needle was placed into the area of the torn cuff and a PDS suture was passed into the joint and brought out the anterior portal.  The scope was removed from the joint and placed in the subacromial space.  We did not find a significant mount of bursitis  Lateral portal was established the shaver was placed into the subacromial space and synovial tissue and bursal tissue was removed for better visualization.  With abduction external rotation and the marking suture that was placed the rotator cuff tear area was identified.  A 3 cm incision was then made over the anterolateral acromion the subcutaneous tissue was divided the deltoid fascia was split in line with its fibers.  The marking suture was then brought out through the wound.  After deep retractors were placed the tear was completed and marked with two #2 Ethibond sutures.  The greater tuberosity was debrided to a bleeding bed and a 5.5 corkscrew suture anchor was placed with 4 suture ends which were passed and tied as a medial row and then the 4 sutures were brought laterally and secured to the lateral humerus with a swivel lock anchor.  The wound was irrigated the shoulder was checked for range of motion there were no restrictions.  The deltoid split was closed with #0 Monocryl followed by  4 subcutaneous 0 Monocryl sutures and then a running 2-0 Monocryl with Steri-Strips.  The portals were closed with interrupted nylon suture  The subdeltoid region was injected with Marcaine with epinephrine  Sterile dressings and sling were applied and the patient was taken recovery room in stable condition

## 2019-11-17 ENCOUNTER — Other Ambulatory Visit: Payer: Self-pay | Admitting: Orthopedic Surgery

## 2019-11-17 MED ORDER — OXYCODONE-ACETAMINOPHEN 10-325 MG PO TABS: 1.0000 | ORAL_TABLET | ORAL | 0 refills | Status: DC | PRN

## 2019-11-17 NOTE — Progress Notes (Signed)
Pain not controlled increasing dose of opioid

## 2019-11-17 NOTE — Progress Notes (Signed)
percocet

## 2019-11-19 ENCOUNTER — Encounter: Payer: Self-pay | Admitting: Orthopedic Surgery

## 2019-11-20 ENCOUNTER — Other Ambulatory Visit: Payer: Self-pay

## 2019-11-20 ENCOUNTER — Ambulatory Visit (INDEPENDENT_AMBULATORY_CARE_PROVIDER_SITE_OTHER): Payer: 59 | Admitting: Orthopedic Surgery

## 2019-11-20 ENCOUNTER — Encounter: Payer: Self-pay | Admitting: Orthopedic Surgery

## 2019-11-20 DIAGNOSIS — F17210 Nicotine dependence, cigarettes, uncomplicated: Secondary | ICD-10-CM

## 2019-11-20 DIAGNOSIS — S46011A Strain of muscle(s) and tendon(s) of the rotator cuff of right shoulder, initial encounter: Secondary | ICD-10-CM

## 2019-11-20 DIAGNOSIS — S46011D Strain of muscle(s) and tendon(s) of the rotator cuff of right shoulder, subsequent encounter: Secondary | ICD-10-CM

## 2019-11-20 DIAGNOSIS — Z9889 Other specified postprocedural states: Secondary | ICD-10-CM | POA: Insufficient documentation

## 2019-11-20 MED ORDER — OXYCODONE-ACETAMINOPHEN 10-325 MG PO TABS: 1.0000 | ORAL_TABLET | ORAL | 0 refills | Status: AC | PRN

## 2019-11-20 NOTE — Progress Notes (Signed)
Chief Complaint  Patient presents with  . Post-op Follow-up    right shoulder RCR 11/15/2019   Mini open rotator cuff repair with arthroscopy.  Findings partial thickness rotator cuff tear which was taken down to completion and then rerepaired biceps was normal mild chondral thinning noted on the glenoid side labral degeneration also noted  Patient is having constipation had severe night pain in the biceps area  Wound looks good Steri-Strips were exchanged portal sutures were removed  Patient will start therapy in [place] next week  She can shower on Friday  Follow-up will be March 2    Meds ordered this encounter  Medications  . oxyCODONE-acetaminophen (PERCOCET) 10-325 MG tablet    Sig: Take 1 tablet by mouth every 4 (four) hours as needed for up to 7 days for pain.    Dispense:  42 tablet    Refill:  0    Recent shoulder surgery 5 mg not controlling pain, already taking ibuprofen, tizanidine , I m increasing the dose

## 2019-11-20 NOTE — Patient Instructions (Addendum)
Start therapy Monday   Drink fluids   Shower ok Friday   Drive short distances only

## 2019-11-27 ENCOUNTER — Ambulatory Visit (HOSPITAL_COMMUNITY): Payer: 59 | Attending: Orthopedic Surgery | Admitting: Occupational Therapy

## 2019-11-27 ENCOUNTER — Other Ambulatory Visit: Payer: Self-pay

## 2019-11-27 ENCOUNTER — Encounter: Payer: Self-pay | Admitting: Orthopedic Surgery

## 2019-11-27 ENCOUNTER — Encounter (HOSPITAL_COMMUNITY): Payer: Self-pay | Admitting: Occupational Therapy

## 2019-11-27 ENCOUNTER — Ambulatory Visit (INDEPENDENT_AMBULATORY_CARE_PROVIDER_SITE_OTHER): Payer: 59 | Admitting: Orthopedic Surgery

## 2019-11-27 DIAGNOSIS — R29898 Other symptoms and signs involving the musculoskeletal system: Secondary | ICD-10-CM | POA: Insufficient documentation

## 2019-11-27 DIAGNOSIS — M25511 Pain in right shoulder: Secondary | ICD-10-CM | POA: Diagnosis not present

## 2019-11-27 DIAGNOSIS — M25611 Stiffness of right shoulder, not elsewhere classified: Secondary | ICD-10-CM | POA: Diagnosis present

## 2019-11-27 DIAGNOSIS — Z9889 Other specified postprocedural states: Secondary | ICD-10-CM

## 2019-11-27 NOTE — Therapy (Signed)
[facility] [place], [place], [address] Phone: [phone]   Fax:  [phone]  Occupational Therapy Evaluation  Patient Details  Name: EARLY ORD MRN: [id number] Date of Birth: May 29, 1961 Referring Provider (OT): Dr. Arther Abbott   Encounter Date: 11/27/2019  OT End of Session - 11/27/19 1159    Visit Number  1    Number of Visits  16    Date for OT Re-Evaluation  01/26/20   Mini-reassessment 12/28/2019   Authorization Type  [employer]    Authorization Time Period  $25 copay, 60 visit limit    Authorization - Visit Number  1    Authorization - Number of Visits  14    OT Start Time  9024    OT Stop Time  1149    OT Time Calculation (min)  32 min    Activity Tolerance  Patient tolerated treatment well    Behavior During Therapy  [facility] for tasks assessed/performed       Past Medical History:  Diagnosis Date  . Arthritis   . Asthma   . Cataract   . Complication of anesthesia    Fever after all surgeries  . Depression   . Joint pain   . Nausea   . Smoker     Past Surgical History:  Procedure Laterality Date  . ABDOMINAL HYSTERECTOMY  2000  . APPENDECTOMY  1981  . CATARACT EXTRACTION W/ INTRAOCULAR LENS IMPLANT Left   . SHOULDER ARTHROSCOPY WITH ROTATOR CUFF REPAIR Right 11/15/2019   Procedure: SHOULDER ARTHROSCOPY WITH ROTATOR CUFF REPAIR;  Surgeon: Carole Civil, MD;  Location: [facility];  Service: Orthopedics;  Laterality: Right;  . TONSILLECTOMY     59 years old    There were no vitals filed for this visit.  Subjective Assessment - 11/27/19 1156    Subjective   S: I've got to get this arm moving again.    Pertinent History  Pt is a 59 y/o female s/p right mini-open RCR with arthroscopy on 11/15/19. 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 Pt presents to clinic wearing sling, reports she isn't using sling during the day, only when driving. Pt was referred to  occupational therapy for evaluation and treatment by Dr. Arther Abbott.    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    Currently in Pain?  Yes    Pain Score  2     Pain Location  Shoulder    Pain Orientation  Right    Pain Descriptors / Indicators  Aching;Sore    Pain Type  Acute pain    Pain Radiating Towards  upper arm    Pain Onset  1 to 4 weeks ago    Pain Frequency  Intermittent    Aggravating Factors   movement    Pain Relieving Factors  ice, pain medication    Effect of Pain on Daily Activities  max effect on ADL completion    Multiple Pain Sites  No        [facility] OT Assessment - 11/27/19 1114      Assessment   Medical Diagnosis  s/p right mini-open RCR with arthroscopy    Referring Provider (OT)  Dr. Arther Abbott    Onset Date/Surgical Date  11/15/19    Hand Dominance  Left    Prior Therapy  None  Precautions   Precautions  Shoulder    Type of Shoulder Precautions  See protocol: 2/18-3/18: P/ROM;  3/19: AA/ROM & progress as tolerated       Restrictions   Weight Bearing Restrictions  No      Balance Screen   Has the patient fallen in the past 6 months  No    Has the patient had a decrease in activity level because of a fear of falling?   No    Is the patient reluctant to leave their home because of a fear of falling?   No      Prior Function   Level of Independence  Independent    Vocation  Retired    Leisure  Owns a farm-cows, horses, goats, Programme researcher, broadcasting/film/video, dogs, cat      ADL   ADL comments  Pt is having difficulty with dressing, bathing, washing/fixing hair, sleeping, doing farm work      Barrister's clerk Expression   Dominant Hand  Left      Cognition   Overall Cognitive Status  Within Functional Limits for tasks assessed      Observation/Other Assessments   Focus on Therapeutic Outcomes (FOTO)   49/100      ROM / Strength   AROM / PROM / Strength  AROM;PROM;Strength      Palpation   Palpation  comment  max fascial restrictions in right upper arm, trapezius, and scapular regions      AROM   Overall AROM   Unable to assess;Due to precautions    AROM Assessment Site  Shoulder    Right/Left Shoulder  Right                      PROM   Overall PROM Comments  Assessed supine, er/IR adducted    PROM Assessment Site  Shoulder    Right/Left Shoulder  Right    Right Shoulder Flexion  101 Degrees    Right Shoulder ABduction  65 Degrees    Right Shoulder Internal Rotation  90 Degrees    Right Shoulder External Rotation  32 Degrees      Strength   Overall Strength  Unable to assess;Due to precautions                      OT Education - 11/27/19 1142    Education Details  table slides    Person(s) Educated  Patient    Methods  Explanation;Demonstration;Handout    Comprehension  Verbalized understanding;Returned demonstration       OT Short Term Goals - 11/27/19 1308      OT SHORT TERM GOAL #1   Title  Pt will be provided with and educated on HEP to improve mobility required for RUE use during ADLs.    Time  4    Period  Weeks    Status  New    Target Date  12/27/19      OT SHORT TERM GOAL #2   Title  Pt will increase RUE P/ROM to [facility] to improve ability to perform dressing tasks with minimal compensatory techniques.    Time  4    Period  Weeks    Status  New      OT SHORT TERM GOAL #3   Title  Pt will increase RUE strength to 3+/5 to improve ability to use RUE for tasks completed at waist to chest height.    Time  4    Period  Weeks  Status  New        OT Long Term Goals - 11/27/19 1309      OT LONG TERM GOAL #1   Title  Pt will return to highest level of functioning during ADL and leisure tasks using RUE as non-dominant.    Time  8    Period  Weeks    Status  New    Target Date  01/26/20      OT LONG TERM GOAL #2   Title  Pt will decrease pain in RUE to 3/10 or less to improve ability to sleep for 4 consecutive hours or greater.    Time   8    Period  Weeks    Status  New      OT LONG TERM GOAL #3   Title  Pt will increase RUE A/ROM to [facility] to improve ability to perform reaching tasks overhead and behind back.    Time  8    Period  Weeks    Status  New      OT LONG TERM GOAL #4   Title  Pt will decrease fascial restrictions in RUE to minimal amounts or less to improve mobility required for functional reaching tasks.    Time  8    Period  Weeks    Status  New      OT LONG TERM GOAL #5   Title  Pt will increase RUE strength to 4+/5 or greater to improve use of RUE during farm work including lifting or carrying weighted objects.    Time  8    Period  Weeks    Status  New            Plan - 11/27/19 1200    Clinical Impression Statement  A: Pt is a 59 y/o female s/p right mini-open RCR with arthroscopy on 11/15/19 presenting with limitations in functional use of RUE during ADLs. Pt reports she is not wearing sling and is beginning to use the arm for reaching, fixing hair, and has carried light groceries down by her side. Educated pt on precautions and to not lift weighted items or actively reach due to recent repair and the tissues/tendons need for healing.    OT Occupational Profile and History  Problem Focused Assessment - Including review of records relating to presenting problem    Occupational performance deficits (Please refer to evaluation for details):  ADL's;IADL's;Rest and Sleep;Work;Leisure    Body Structure / Function / Physical Skills  ADL;Endurance;Muscle spasms;UE functional use;Fascial restriction;Pain;ROM;IADL;Strength    Rehab Potential  Good    Clinical Decision Making  Limited treatment options, no task modification necessary    Comorbidities Affecting Occupational Performance:  None    Modification or Assistance to Complete Evaluation   No modification of tasks or assist necessary to complete eval    OT Frequency  2x / week    OT Duration  8 weeks    OT Treatment/Interventions  Self-care/ADL  training;Ultrasound;Patient/family education;Passive range of motion;Cryotherapy;Electrical Stimulation;Moist Heat;Therapeutic exercise;Manual Therapy;Therapeutic activities    Plan  P: Pt will benefit from skilled OT services to decrease pain and fascial restrictions, increase joint ROM, strength, and functional use of RUE during ADLs. Treatment plan: myofascial release, manual therapy, P/ROM, AA/ROM, A/ROM, general RUE strengthening, scapular mobility/stability/strengthening, modalities prn    OT Home Exercise Plan  11/27/19: table slides    Consulted and Agree with Plan of Care  Patient       Patient will benefit from skilled  therapeutic intervention in order to improve the following deficits and impairments:   Body Structure / Function / Physical Skills: ADL, Endurance, Muscle spasms, UE functional use, Fascial restriction, Pain, ROM, IADL, Strength       Visit Diagnosis: Acute pain of right shoulder  Stiffness of right shoulder, not elsewhere classified  Other symptoms and signs involving the musculoskeletal system    Problem List Patient Active Problem List   Diagnosis Date Noted  . S/P rotator cuff repair right 11/15/19 11/20/2019  . Nontraumatic incomplete tear of right rotator cuff   . Elevated hemoglobin A1c 12/26/2018  . Centrilobular emphysema (HCC) 12/12/2018  . Moderate recurrent major depression (HCC) 12/12/2018  . Elevated BP without diagnosis of hypertension 12/12/2018  . Hyperlipidemia 02/18/2017  . Psoriasis 11/11/2016  . Mild intermittent asthma 10/31/2015  . History of lymphadenitis 10/31/2015  . Tobacco abuse 10/31/2015  . Vitamin D deficiency 10/31/2015  . Allergic rhinitis due to pollen 10/31/2015   Ezra Sites, OTR/L  [phone] 11/27/2019, 1:13 PM  [facility] [address] [place], [place], [address] Phone: [phone]   Fax:  [phone]  Name: ARDEN AXON MRN: [id number] Date of Birth:  06-03-1961

## 2019-11-27 NOTE — Progress Notes (Signed)
Chief Complaint  Patient presents with  . Post-op Follow-up    11/15/19 right shoulder / started PT today     59 year old female right rotator cuff repair doing well wound looks clean I reviewed her PT notes she is a little too aggressive week told her to calm it down some her pain in the biceps is still there but much improved  Mainly taking ibuprofen at this time  Follow-up in 3 weeks continue therapy  Encounter Diagnosis  Name Primary?  . S/P rotator cuff repair right 11/15/19 Yes

## 2019-11-27 NOTE — Patient Instructions (Signed)
SHOULDER: Flexion On Table   Place hands on towel placed on table, elbows straight. Lean forward with you upper body, pushing towel away from body.  _15__ reps per set, _2-3__ sets per day  Abduction (Passive)   With arm out to side, resting on towel placed on table with palm DOWN, keeping trunk away from table, lean to the side while pushing towel away from body.  Repeat __15__ times. Do _2-3___ sessions per day.  Copyright  VHI. All rights reserved.     Internal Rotation (Assistive)   Seated with elbow bent at right angle and held against side, slide arm on table surface in an inward arc keeping elbow anchored in place. Repeat _15___ times. Do __2-3__ sessions per day. Activity: Use this motion to brush crumbs off the table.  Copyright  VHI. All rights reserved.    

## 2019-11-28 ENCOUNTER — Ambulatory Visit: Payer: 59 | Admitting: Orthopedic Surgery

## 2019-11-30 ENCOUNTER — Encounter (HOSPITAL_COMMUNITY): Payer: Self-pay | Admitting: Occupational Therapy

## 2019-11-30 ENCOUNTER — Ambulatory Visit (HOSPITAL_COMMUNITY): Payer: 59 | Admitting: Occupational Therapy

## 2019-11-30 ENCOUNTER — Other Ambulatory Visit: Payer: Self-pay

## 2019-11-30 DIAGNOSIS — R29898 Other symptoms and signs involving the musculoskeletal system: Secondary | ICD-10-CM

## 2019-11-30 DIAGNOSIS — M25511 Pain in right shoulder: Secondary | ICD-10-CM | POA: Diagnosis not present

## 2019-11-30 DIAGNOSIS — M25611 Stiffness of right shoulder, not elsewhere classified: Secondary | ICD-10-CM

## 2019-11-30 NOTE — Therapy (Signed)
[facility] [address] [place], [place], [address] Phone: [phone]   Fax:  [phone]  Occupational Therapy Treatment  Patient Details  Name: Jacqueline [name] MRN: [id number] Date of Birth: [birth year] Referring Provider (OT): Dr. Fuller Canada   Encounter Date: 11/30/2019  OT End of Session - 11/30/19 1610    Visit Number  2    Number of Visits  16    Date for OT Re-Evaluation  01/26/20   Mini-reassessment 12/28/2019   Authorization Type  [employer]    Authorization Time Period  $25 copay, 60 visit limit    Authorization - Visit Number  2    Authorization - Number of Visits  60    OT Start Time  1430    OT Stop Time  1509    OT Time Calculation (min)  39 min    Activity Tolerance  Patient tolerated treatment well    Behavior During Therapy  [facility] for tasks assessed/performed       Past Medical History:  Diagnosis Date  . Arthritis   . Asthma   . Cataract   . Complication of anesthesia    Fever after all surgeries  . Depression   . Joint pain   . Nausea   . Smoker     Past Surgical History:  Procedure Laterality Date  . ABDOMINAL HYSTERECTOMY  2000  . APPENDECTOMY  1981  . CATARACT EXTRACTION W/ INTRAOCULAR LENS IMPLANT Left   . SHOULDER ARTHROSCOPY WITH ROTATOR CUFF REPAIR Right 11/15/2019   Procedure: SHOULDER ARTHROSCOPY WITH ROTATOR CUFF REPAIR;  Surgeon: Vickki Hearing, MD;  Location: [facility];  Service: Orthopedics;  Laterality: Right;  . TONSILLECTOMY     59 years old    There were no vitals filed for this visit.  Subjective Assessment - 11/30/19 1607    Subjective   S: It's really been bothering me.    Currently in Pain?  Yes    Pain Score  8     Pain Location  Shoulder    Pain Orientation  Right    Pain Descriptors / Indicators  Aching;Sore;Cramping    Pain Type  Acute pain    Pain Radiating Towards  upper arm to elbow    Pain Onset  Yesterday    Pain Frequency  Intermittent    Aggravating Factors    movement, exercise    Pain Relieving Factors  ice, pain medication    Effect of Pain on Daily Activities  max effect on ADL completion    Multiple Pain Sites  No         [facility] OT Assessment - 11/30/19 1453      Assessment   Medical Diagnosis  s/p right mini-open RCR with arthroscopy      Precautions   Precautions  Shoulder    Type of Shoulder Precautions  See protocol: 2/18-3/18: P/ROM;  3/19: AA/ROM & progress as tolerated                OT Treatments/Exercises (OP) - 11/30/19 1608      Exercises   Exercises  Shoulder      Shoulder Exercises: Supine   Protraction  PROM;10 reps    Horizontal ABduction  PROM;10 reps    External Rotation  PROM;10 reps    Internal Rotation  PROM;10 reps    Flexion  PROM;10 reps    ABduction  PROM;10 reps      Shoulder Exercises: Seated   Elevation  AROM;10 reps    Extension  AROM;10 reps    Row  AROM;10 reps      Shoulder Exercises: Therapy Ball   Flexion  10 reps    ABduction  10 reps      Shoulder Exercises: ROM/Strengthening   Anterior Glide  3x10"      Shoulder Exercises: Isometric Strengthening   Flexion  Supine;3X5"    Extension  Supine;3X5"    External Rotation  Supine;3X5"    Internal Rotation  Supine;3X5"    ABduction  Supine;3X5"    ADduction  Supine;3X5"      Manual Therapy   Manual Therapy  Myofascial release    Manual therapy comments  completed separately from therapeutic exercises    Myofascial Release  Myofascial release and manual techniques to right upper arm, trapezius, and scapular regions to decrease pain and fascial restrictions and increase joint ROM               OT Short Term Goals - 11/30/19 1718      OT SHORT TERM GOAL #1   Title  Pt will be provided with and educated on HEP to improve mobility required for RUE use during ADLs.    Time  4    Period  Weeks    Status  On-going    Target Date  12/27/19      OT SHORT TERM GOAL #2   Title  Pt will increase RUE P/ROM to [facility] to  improve ability to perform dressing tasks with minimal compensatory techniques.    Time  4    Period  Weeks    Status  On-going      OT SHORT TERM GOAL #3   Title  Pt will increase RUE strength to 3+/5 to improve ability to use RUE for tasks completed at waist to chest height.    Time  4    Period  Weeks    Status  On-going        OT Long Term Goals - 11/30/19 1718      OT LONG TERM GOAL #1   Title  Pt will return to highest level of functioning during ADL and leisure tasks using RUE as non-dominant.    Time  8    Period  Weeks    Status  On-going      OT LONG TERM GOAL #2   Title  Pt will decrease pain in RUE to 3/10 or less to improve ability to sleep for 4 consecutive hours or greater.    Time  8    Period  Weeks    Status  On-going      OT LONG TERM GOAL #3   Title  Pt will increase RUE A/ROM to [facility] to improve ability to perform reaching tasks overhead and behind back.    Time  8    Period  Weeks    Status  On-going      OT LONG TERM GOAL #4   Title  Pt will decrease fascial restrictions in RUE to minimal amounts or less to improve mobility required for functional reaching tasks.    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    Time  8    Period  Weeks    Status  On-going            Plan - 11/30/19 1610    Clinical Impression Statement  A: Initiated myofascial release and manual techniques, P/ROM, isometrics, and blue therapy ball exercises this session. Pt tolerating approximately 50% ROM for flexion, <50% for abduction and er today. Pt with guarding and hiking of shoulder during passive stretching, mod tactile cuing and max verbal cuing to address. Verbal cuing for form and technique.    Body Structure / Function / Physical Skills  ADL;Endurance;Muscle spasms;UE functional use;Fascial  restriction;Pain;ROM;IADL;Strength    Plan  P: Continue to work on improving P/ROM and reducing pt guarding. Add caudle shoulder glide       Patient will benefit from skilled therapeutic intervention in order to improve the following deficits and impairments:   Body Structure / Function / Physical Skills: ADL, Endurance, Muscle spasms, UE functional use, Fascial restriction, Pain, ROM, IADL, Strength       Visit Diagnosis: Acute pain of right shoulder  Stiffness of right shoulder, not elsewhere classified  Other symptoms and signs involving the musculoskeletal system    Problem List Patient Active Problem List   Diagnosis Date Noted  . S/P rotator cuff repair right 11/15/19 11/20/2019  . Nontraumatic incomplete tear of right rotator cuff   . Elevated hemoglobin A1c 12/26/2018  . Centrilobular emphysema ([place]) 12/12/2018  . Moderate recurrent major depression ([place]) 12/12/2018  . Elevated BP without diagnosis of hypertension 12/12/2018  . Hyperlipidemia 02/18/2017  . Psoriasis 11/11/2016  . Mild intermittent asthma 10/31/2015  . History of lymphadenitis 10/31/2015  . Tobacco abuse 10/31/2015  . Vitamin D deficiency 10/31/2015  . Allergic rhinitis due to pollen 10/31/2015   Guadelupe Sabin, OTR/L  [phone] 11/30/2019, 5:18 PM  [place], [place], [address] Phone: [phone]   Fax:  [phone]  Name: Jacqueline [name] MRN: [id number] Date of Birth: 01-27-1961

## 2019-12-04 ENCOUNTER — Ambulatory Visit (HOSPITAL_COMMUNITY): Payer: 59 | Admitting: Occupational Therapy

## 2019-12-04 ENCOUNTER — Encounter (HOSPITAL_COMMUNITY): Payer: Self-pay | Admitting: Occupational Therapy

## 2019-12-04 ENCOUNTER — Other Ambulatory Visit: Payer: Self-pay

## 2019-12-04 DIAGNOSIS — M25511 Pain in right shoulder: Secondary | ICD-10-CM

## 2019-12-04 DIAGNOSIS — R29898 Other symptoms and signs involving the musculoskeletal system: Secondary | ICD-10-CM

## 2019-12-04 DIAGNOSIS — M25611 Stiffness of right shoulder, not elsewhere classified: Secondary | ICD-10-CM

## 2019-12-04 NOTE — Therapy (Signed)
[place], [place], [address] Phone: [phone]   Fax:  [phone]  Occupational Therapy Treatment  Patient Details  Name: Jacqueline [name] MRN: [id number] Date of Birth: [birth year] Referring Provider (OT): Dr. Arther Abbott   Encounter Date: 12/04/2019  OT End of Session - 12/04/19 1105    Visit Number  3    Number of Visits  16    Date for OT Re-Evaluation  01/26/20   Mini-reassessment 12/28/2019   Authorization Type  [employer]HC    Authorization Time Period  $25 copay, 60 visit limit    Authorization - Visit Number  3    Authorization - Number of Visits  51    OT Start Time  1030    OT Stop Time  1108    OT Time Calculation (min)  38 min    Activity Tolerance  Patient tolerated treatment well    Behavior During Therapy  [facility] for tasks assessed/performed       Past Medical History:  Diagnosis Date  . Arthritis   . Asthma   . Cataract   . Complication of anesthesia    Fever after all surgeries  . Depression   . Joint pain   . Nausea   . Smoker     Past Surgical History:  Procedure Laterality Date  . ABDOMINAL HYSTERECTOMY  2000  . APPENDECTOMY  1981  . CATARACT EXTRACTION W/ INTRAOCULAR LENS IMPLANT Left   . SHOULDER ARTHROSCOPY WITH ROTATOR CUFF REPAIR Right 11/15/2019   Procedure: SHOULDER ARTHROSCOPY WITH ROTATOR CUFF REPAIR;  Surgeon: Carole Civil, MD;  Location: [facility];  Service: Orthopedics;  Laterality: Right;  . TONSILLECTOMY     59 years old    There were no vitals filed for this visit.  Subjective Assessment - 12/04/19 1030    Subjective   S: It was sore after last session.    Currently in Pain?  Yes    Pain Score  5     Pain Location  Shoulder    Pain Orientation  Right    Pain Descriptors / Indicators  Aching;Sore    Pain Type  Acute pain    Pain Radiating Towards  upper arm to elbow    Pain Onset  In the past 7 days    Pain Frequency  Intermittent    Aggravating Factors    movement, exercise    Pain Relieving Factors  ice, pain medication    Effect of Pain on Daily Activities  max effect on ADL completion    Multiple Pain Sites  No         [facility] OT Assessment - 12/04/19 1030      Assessment   Medical Diagnosis  s/p right mini-open RCR with arthroscopy      Precautions   Precautions  Shoulder    Type of Shoulder Precautions  See protocol: 2/18-3/18: P/ROM;  3/19: AA/ROM & progress as tolerated                OT Treatments/Exercises (OP) - 12/04/19 1033      Exercises   Exercises  Shoulder      Shoulder Exercises: Supine   Protraction  PROM;10 reps    Horizontal ABduction  PROM;10 reps    External Rotation  PROM;10 reps    Internal Rotation  PROM;10 reps    Flexion  PROM;10 reps    ABduction  PROM;10 reps      Shoulder Exercises:  Seated   Elevation  AROM;10 reps    Extension  AROM;10 reps    Row  AROM;10 reps    Other Seated Exercises  scapular depression, A/ROM, 10X      Shoulder Exercises: Therapy Ball   Flexion  10 reps    ABduction  10 reps      Shoulder Exercises: ROM/Strengthening   Thumb Tacks  1' low level    Anterior Glide  3x10"    Caudal Glide  3x10"      Shoulder Exercises: Isometric Strengthening   Flexion  Supine;3X5"    Extension  Supine;3X5"    External Rotation  Supine;3X5"    Internal Rotation  Supine;3X5"    ABduction  Supine;3X5"    ADduction  Supine;3X5"      Manual Therapy   Manual Therapy  Myofascial release    Manual therapy comments  completed separately from therapeutic exercises    Myofascial Release  Myofascial release and manual techniques to right upper arm, trapezius, and scapular regions to decrease pain and fascial restrictions and increase joint ROM               OT Short Term Goals - 11/30/19 1718      OT SHORT TERM GOAL #1   Title  Pt will be provided with and educated on HEP to improve mobility required for RUE use during ADLs.    Time  4    Period  Weeks    Status   On-going    Target Date  12/27/19      OT SHORT TERM GOAL #2   Title  Pt will increase RUE P/ROM to [facility] to improve ability to perform dressing tasks with minimal compensatory techniques.    Time  4    Period  Weeks    Status  On-going      OT SHORT TERM GOAL #3   Title  Pt will increase RUE strength to 3+/5 to improve ability to use RUE for tasks completed at waist to chest height.    Time  4    Period  Weeks    Status  On-going        OT Long Term Goals - 11/30/19 1718      OT LONG TERM GOAL #1   Title  Pt will return to highest level of functioning during ADL and leisure tasks using RUE as non-dominant.    Time  8    Period  Weeks    Status  On-going      OT LONG TERM GOAL #2   Title  Pt will decrease pain in RUE to 3/10 or less to improve ability to sleep for 4 consecutive hours or greater.    Time  8    Period  Weeks    Status  On-going      OT LONG TERM GOAL #3   Title  Pt will increase RUE A/ROM to [facility] to improve ability to perform reaching tasks overhead and behind back.    Time  8    Period  Weeks    Status  On-going      OT LONG TERM GOAL #4   Title  Pt will decrease fascial restrictions in RUE to minimal amounts or less to improve mobility required for functional reaching tasks.    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    Time  8    Period  Weeks    Status  On-going            Plan - 12/04/19 1103    Clinical Impression Statement  A: Continued with manual techniques, isometrics, and passive stretching today. P with improved tolerance during P/ROM, abduction improves with traction to limit muscle spasm/cramping. Added caudal glide and low level thumb tacks today. Verbal cuing for form and technique, reminders to depress trapezius.    Body Structure / Function / Physical  Skills  ADL;Endurance;Muscle spasms;UE functional use;Fascial restriction;Pain;ROM;IADL;Strength    Plan  P: Continue to work on improving P/ROM and reducing pt guarding-work towards gaining ROM >50%       Patient will benefit from skilled therapeutic intervention in order to improve the following deficits and impairments:   Body Structure / Function / Physical Skills: ADL, Endurance, Muscle spasms, UE functional use, Fascial restriction, Pain, ROM, IADL, Strength       Visit Diagnosis: Acute pain of right shoulder  Stiffness of right shoulder, not elsewhere classified  Other symptoms and signs involving the musculoskeletal system    Problem List Patient Active Problem List   Diagnosis Date Noted  . S/P rotator cuff repair right 11/15/19 11/20/2019  . Nontraumatic incomplete tear of right rotator cuff   . Elevated hemoglobin A1c 12/26/2018  . Centrilobular emphysema ([facility]) 12/12/2018  . Moderate recurrent major depression ([facility]) 12/12/2018  . Elevated BP without diagnosis of hypertension 12/12/2018  . Hyperlipidemia 02/18/2017  . Psoriasis 11/11/2016  . Mild intermittent asthma 10/31/2015  . History of lymphadenitis 10/31/2015  . Tobacco abuse 10/31/2015  . Vitamin D deficiency 10/31/2015  . Allergic rhinitis due to pollen 10/31/2015   Ezra Sites, OTR/L  [phone] 12/04/2019, 11:09 AM  [facility] [address] [place], [place], [address] Phone: [phone]   Fax:  [phone]  Name: Jacqueline [name] MRN: [id number] Date of Birth: [birth year]

## 2019-12-06 ENCOUNTER — Other Ambulatory Visit: Payer: Self-pay

## 2019-12-06 ENCOUNTER — Ambulatory Visit (HOSPITAL_COMMUNITY): Payer: 59 | Admitting: Occupational Therapy

## 2019-12-06 ENCOUNTER — Encounter (HOSPITAL_COMMUNITY): Payer: Self-pay | Admitting: Occupational Therapy

## 2019-12-06 DIAGNOSIS — M25511 Pain in right shoulder: Secondary | ICD-10-CM | POA: Diagnosis not present

## 2019-12-06 DIAGNOSIS — M25611 Stiffness of right shoulder, not elsewhere classified: Secondary | ICD-10-CM

## 2019-12-06 DIAGNOSIS — R29898 Other symptoms and signs involving the musculoskeletal system: Secondary | ICD-10-CM

## 2019-12-06 NOTE — Therapy (Signed)
[place], [place], [address] Phone: [phone]   Fax:  [phone]  Occupational Therapy Treatment  Patient Details  Name: Jacqueline [name] MRN: [id number] Date of Birth: [birth year] Referring Provider (OT): Dr. Arther Abbott   Encounter Date: 12/06/2019  OT End of Session - 12/06/19 0934    Visit Number  4    Number of Visits  16    Date for OT Re-Evaluation  01/26/20   Mini-reassessment 12/28/2019   Authorization Type  [employer]    Authorization Time Period  $25 copay, 60 visit limit    Authorization - Visit Number  4    Authorization - Number of Visits  60    OT Start Time  6256    OT Stop Time  0939    OT Time Calculation (min)  42 min    Activity Tolerance  Patient tolerated treatment well    Behavior During Therapy  [facility] for tasks assessed/performed       Past Medical History:  Diagnosis Date  . Arthritis   . Asthma   . Cataract   . Complication of anesthesia    Fever after all surgeries  . Depression   . Joint pain   . Nausea   . Smoker     Past Surgical History:  Procedure Laterality Date  . ABDOMINAL HYSTERECTOMY  2000  . APPENDECTOMY  1981  . CATARACT EXTRACTION W/ INTRAOCULAR LENS IMPLANT Left   . SHOULDER ARTHROSCOPY WITH ROTATOR CUFF REPAIR Right 11/15/2019   Procedure: SHOULDER ARTHROSCOPY WITH ROTATOR CUFF REPAIR;  Surgeon: Carole Civil, MD;  Location: [facility];  Service: Orthopedics;  Laterality: Right;  . TONSILLECTOMY     59 years old    There were no vitals filed for this visit.  Subjective Assessment - 12/06/19 0856    Subjective   S: It's my elbow that's bothering me.    Currently in Pain?  Yes    Pain Score  5     Pain Location  Elbow    Pain Orientation  Right    Pain Descriptors / Indicators  Aching;Sore    Pain Type  Acute pain    Pain Radiating Towards  to elbow    Pain Onset  In the past 7 days    Pain Frequency  Intermittent    Aggravating Factors    movement, exercise    Pain Relieving Factors  ice, pain medication    Effect of Pain on Daily Activities  max effect on ADL Completion    Multiple Pain Sites  No         [facility] OT Assessment - 12/06/19 0856      Assessment   Medical Diagnosis  s/p right mini-open RCR with arthroscopy      Precautions   Precautions  Shoulder    Type of Shoulder Precautions  See protocol: 2/18-3/18: P/ROM;  3/19: AA/ROM & progress as tolerated                OT Treatments/Exercises (OP) - 12/06/19 0900      Exercises   Exercises  Shoulder      Shoulder Exercises: Supine   Protraction  PROM;10 reps    Horizontal ABduction  PROM;10 reps    External Rotation  PROM;10 reps    Internal Rotation  PROM;10 reps    Flexion  PROM;10 reps    ABduction  PROM;10 reps      Shoulder Exercises: Seated  Extension  AROM;10 reps    Row  AROM;10 reps      Shoulder Exercises: Therapy Ball   Flexion  10 reps    ABduction  10 reps      Shoulder Exercises: ROM/Strengthening   Thumb Tacks  1' low level    Caudal Glide  3x10"    Prot/Ret//Elev/Dep  1' low level      Shoulder Exercises: Isometric Strengthening   Flexion  Supine;5X5"    Extension  Supine;5X5"    External Rotation  Supine;5X5"    Internal Rotation  Supine;5X5"    ABduction  Supine;5X5"    ADduction  Supine;5X5"      Manual Therapy   Manual Therapy  Myofascial release    Manual therapy comments  completed separately from therapeutic exercises    Myofascial Release  Myofascial release and manual techniques to right upper arm, trapezius, and scapular regions to decrease pain and fascial restrictions and increase joint ROM             OT Education - 12/06/19 0931    Education Details  scapular A/ROM for extension and row    Person(s) Educated  Patient    Methods  Explanation;Demonstration;Handout    Comprehension  Verbalized understanding;Returned demonstration       OT Short Term Goals - 11/30/19 1718      OT SHORT  TERM GOAL #1   Title  Pt will be provided with and educated on HEP to improve mobility required for RUE use during ADLs.    Time  4    Period  Weeks    Status  On-going    Target Date  12/27/19      OT SHORT TERM GOAL #2   Title  Pt will increase RUE P/ROM to [facility] to improve ability to perform dressing tasks with minimal compensatory techniques.    Time  4    Period  Weeks    Status  On-going      OT SHORT TERM GOAL #3   Title  Pt will increase RUE strength to 3+/5 to improve ability to use RUE for tasks completed at waist to chest height.    Time  4    Period  Weeks    Status  On-going        OT Long Term Goals - 11/30/19 1718      OT LONG TERM GOAL #1   Title  Pt will return to highest level of functioning during ADL and leisure tasks using RUE as non-dominant.    Time  8    Period  Weeks    Status  On-going      OT LONG TERM GOAL #2   Title  Pt will decrease pain in RUE to 3/10 or less to improve ability to sleep for 4 consecutive hours or greater.    Time  8    Period  Weeks    Status  On-going      OT LONG TERM GOAL #3   Title  Pt will increase RUE A/ROM to [facility] to improve ability to perform reaching tasks overhead and behind back.    Time  8    Period  Weeks    Status  On-going      OT LONG TERM GOAL #4   Title  Pt will decrease fascial restrictions in RUE to minimal amounts or less to improve mobility required for functional reaching tasks.    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    Time  8    Period  Weeks    Status  On-going            Plan - 12/06/19 0933    Clinical Impression Statement  A: Continued with manual techniques, pt with improvement in muscle knot in upper arm and trapezius, less tenderness this session during palpation. Increased isometrics to 5x5", pt able  to tolerate passive flexion to 60% range today. Verbal cuing for form and technique. Tactile cuing to depress trapezius during P/ROM.    Body Structure / Function / Physical Skills  ADL;Endurance;Muscle spasms;UE functional use;Fascial restriction;Pain;ROM;IADL;Strength    Plan  P: Continue working on improving ROM, add pulleys, increase therapy ball stretches to 15    OT Home Exercise Plan  11/27/19: table slides; 12/06/19: scapular A/ROM extension and row    Consulted and Agree with Plan of Care  Patient       Patient will benefit from skilled therapeutic intervention in order to improve the following deficits and impairments:   Body Structure / Function / Physical Skills: ADL, Endurance, Muscle spasms, UE functional use, Fascial restriction, Pain, ROM, IADL, Strength       Visit Diagnosis: Acute pain of right shoulder  Stiffness of right shoulder, not elsewhere classified  Other symptoms and signs involving the musculoskeletal system    Problem List Patient Active Problem List   Diagnosis Date Noted  . S/P rotator cuff repair right 11/15/19 11/20/2019  . Nontraumatic incomplete tear of right rotator cuff   . Elevated hemoglobin A1c 12/26/2018  . Centrilobular emphysema ([place]) 12/12/2018  . Moderate recurrent major depression ([place]) 12/12/2018  . Elevated BP without diagnosis of hypertension 12/12/2018  . Hyperlipidemia 02/18/2017  . Psoriasis 11/11/2016  . Mild intermittent asthma 10/31/2015  . History of lymphadenitis 10/31/2015  . Tobacco abuse 10/31/2015  . Vitamin D deficiency 10/31/2015  . Allergic rhinitis due to pollen 10/31/2015   Guadelupe Sabin, OTR/L  [phone] 12/06/2019, 9:41 AM  [facility] [address] [place], [place], [address] Phone: [phone]   Fax:  [phone]  Name: Jacqueline [name] MRN: [id number] Date of Birth: 1960-10-19

## 2019-12-06 NOTE — Patient Instructions (Signed)
    1) Seated Row   Sit up straight with elbows by your sides. Pull back with shoulders/elbows, keeping forearms straight, as if pulling back on the reins of a horse. Squeeze shoulder blades together. Repeat _10-15__times, __3__sets/day    2) Shoulder Extension    Sit up straight with both arms by your side, draw your arms back behind your waist. Keep your elbows straight. Repeat _10-15___times, _3___sets/day.

## 2019-12-11 ENCOUNTER — Other Ambulatory Visit: Payer: Self-pay

## 2019-12-11 ENCOUNTER — Encounter (HOSPITAL_COMMUNITY): Payer: Self-pay | Admitting: Occupational Therapy

## 2019-12-11 ENCOUNTER — Ambulatory Visit (HOSPITAL_COMMUNITY): Payer: 59 | Admitting: Occupational Therapy

## 2019-12-11 DIAGNOSIS — M25611 Stiffness of right shoulder, not elsewhere classified: Secondary | ICD-10-CM

## 2019-12-11 DIAGNOSIS — R29898 Other symptoms and signs involving the musculoskeletal system: Secondary | ICD-10-CM

## 2019-12-11 DIAGNOSIS — M25511 Pain in right shoulder: Secondary | ICD-10-CM | POA: Diagnosis not present

## 2019-12-11 NOTE — Therapy (Signed)
[place], [place], [address] Phone: [phone]   Fax:  [phone]  Occupational Therapy Treatment  Patient Details  Name: Jacqueline [name] MRN: [id number] Date of Birth: [birth year] Referring Provider (OT): Dr. Arther Abbott   Encounter Date: 12/11/2019  OT End of Session - 12/11/19 1101    Visit Number  5    Number of Visits  16    Date for OT Re-Evaluation  01/26/20   Mini-reassessment 12/28/2019   Authorization Type  [employer]    Authorization Time Period  $25 copay, 60 visit limit    Authorization - Visit Number  5    Authorization - Number of Visits  60    OT Start Time  5397    OT Stop Time  1110    OT Time Calculation (min)  38 min    Activity Tolerance  Patient tolerated treatment well    Behavior During Therapy  WFL for tasks assessed/performed       Past Medical History:  Diagnosis Date  . Arthritis   . Asthma   . Cataract   . Complication of anesthesia    Fever after all surgeries  . Depression   . Joint pain   . Nausea   . Smoker     Past Surgical History:  Procedure Laterality Date  . ABDOMINAL HYSTERECTOMY  2000  . APPENDECTOMY  1981  . CATARACT EXTRACTION W/ INTRAOCULAR LENS IMPLANT Left   . SHOULDER ARTHROSCOPY WITH ROTATOR CUFF REPAIR Right 11/15/2019   Procedure: SHOULDER ARTHROSCOPY WITH ROTATOR CUFF REPAIR;  Surgeon: Carole Civil, MD;  Location: [facility];  Service: Orthopedics;  Laterality: Right;  . TONSILLECTOMY     59 years old    There were no vitals filed for this visit.  Subjective Assessment - 12/11/19 1031    Subjective   S: I've been miserable.    Currently in Pain?  Yes    Pain Score  6     Pain Location  Shoulder    Pain Orientation  Right    Pain Descriptors / Indicators  Aching;Sore    Pain Type  Acute pain    Pain Radiating Towards  to elbow    Pain Onset  In the past 7 days    Pain Frequency  Intermittent    Aggravating Factors   movement, exercise     Pain Relieving Factors  ice, pain medication    Effect of Pain on Daily Activities  max effect on ADL completion    Multiple Pain Sites  No         [facility] OT Assessment - 12/11/19 1031      Assessment   Medical Diagnosis  s/p right mini-open RCR with arthroscopy      Precautions   Precautions  Shoulder    Type of Shoulder Precautions  See protocol: 2/18-3/18: P/ROM;  3/19: AA/ROM & progress as tolerated                OT Treatments/Exercises (OP) - 12/11/19 1033      Exercises   Exercises  Shoulder      Shoulder Exercises: Supine   Protraction  PROM;10 reps    Horizontal ABduction  PROM;10 reps    External Rotation  PROM;10 reps    Internal Rotation  PROM;10 reps    Flexion  PROM;10 reps    ABduction  PROM;10 reps      Shoulder Exercises: Isometric Strengthening   Flexion  Supine;5X5"    Extension  Supine;5X5"    External Rotation  Supine;5X5"    Internal Rotation  Supine;5X5"    ABduction  Supine;5X5"    ADduction  Supine;5X5"      Modalities   Modalities  Electrical Stimulation;Moist Heat      Moist Heat Therapy   Number Minutes Moist Heat  10 Minutes    Moist Heat Location  Shoulder      Electrical Stimulation   Electrical Stimulation Location  right shoulder    Electrical Stimulation Action  interfential     Electrical Stimulation Parameters  16.5CV    Electrical Stimulation Goals  Pain      Manual Therapy   Manual Therapy  Myofascial release    Manual therapy comments  completed separately from therapeutic exercises    Myofascial Release  Myofascial release and manual techniques to right upper arm, trapezius, and scapular regions to decrease pain and fascial restrictions and increase joint ROM               OT Short Term Goals - 11/30/19 1718      OT SHORT TERM GOAL #1   Title  Pt will be provided with and educated on HEP to improve mobility required for RUE use during ADLs.    Time  4    Period  Weeks    Status  On-going     Target Date  12/27/19      OT SHORT TERM GOAL #2   Title  Pt will increase RUE P/ROM to [facility] to improve ability to perform dressing tasks with minimal compensatory techniques.    Time  4    Period  Weeks    Status  On-going      OT SHORT TERM GOAL #3   Title  Pt will increase RUE strength to 3+/5 to improve ability to use RUE for tasks completed at waist to chest height.    Time  4    Period  Weeks    Status  On-going        OT Long Term Goals - 11/30/19 1718      OT LONG TERM GOAL #1   Title  Pt will return to highest level of functioning during ADL and leisure tasks using RUE as non-dominant.    Time  8    Period  Weeks    Status  On-going      OT LONG TERM GOAL #2   Title  Pt will decrease pain in RUE to 3/10 or less to improve ability to sleep for 4 consecutive hours or greater.    Time  8    Period  Weeks    Status  On-going      OT LONG TERM GOAL #3   Title  Pt will increase RUE A/ROM to [facility] to improve ability to perform reaching tasks overhead and behind back.    Time  8    Period  Weeks    Status  On-going      OT LONG TERM GOAL #4   Title  Pt will decrease fascial restrictions in RUE to minimal amounts or less to improve mobility required for functional reaching tasks.    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    Time  8    Period  Weeks    Status  On-going            Plan - 12/11/19 1101    Clinical Impression Statement  A: Pt reporting increased pain since using the arm a lot over the weekend. Completed manual techniques this session to address pain and fascial restrictions. Continued with passive stretching, notable improvement in flexion and er today. Verbal cuing for relaxing shoulder and for form/technique. ES applied after stretching and isometrics for pain management.  Reports improvement in pain after ES.    Body Structure / Function / Physical Skills  ADL;Endurance;Muscle spasms;UE functional use;Fascial restriction;Pain;ROM;IADL;Strength    Plan  P: Follow up on pain, begin AA/ROM in supine if pt able to tolerate    OT Home Exercise Plan  11/27/19: table slides; 12/06/19: scapular A/ROM extension and row    Consulted and Agree with Plan of Care  Patient       Patient will benefit from skilled therapeutic intervention in order to improve the following deficits and impairments:   Body Structure / Function / Physical Skills: ADL, Endurance, Muscle spasms, UE functional use, Fascial restriction, Pain, ROM, IADL, Strength       Visit Diagnosis: Acute pain of right shoulder  Stiffness of right shoulder, not elsewhere classified  Other symptoms and signs involving the musculoskeletal system    Problem List Patient Active Problem List   Diagnosis Date Noted  . S/P rotator cuff repair right 11/15/19 11/20/2019  . Nontraumatic incomplete tear of right rotator cuff   . Elevated hemoglobin A1c 12/26/2018  . Centrilobular emphysema (HCC) 12/12/2018  . Moderate recurrent major depression (HCC) 12/12/2018  . Elevated BP without diagnosis of hypertension 12/12/2018  . Hyperlipidemia 02/18/2017  . Psoriasis 11/11/2016  . Mild intermittent asthma 10/31/2015  . History of lymphadenitis 10/31/2015  . Tobacco abuse 10/31/2015  . Vitamin D deficiency 10/31/2015  . Allergic rhinitis due to pollen 10/31/2015   Ezra Sites, OTR/L  [phone] 12/11/2019, 11:15 AM  [facility] [address] [place], [place], [address] Phone: [phone]   Fax:  [phone]  Name: Jacqueline [name] MRN: [id number] Date of Birth: Sep 20, 1961

## 2019-12-13 ENCOUNTER — Encounter (HOSPITAL_COMMUNITY): Payer: Self-pay | Admitting: Occupational Therapy

## 2019-12-13 ENCOUNTER — Other Ambulatory Visit: Payer: Self-pay

## 2019-12-13 ENCOUNTER — Ambulatory Visit (HOSPITAL_COMMUNITY): Payer: 59 | Admitting: Occupational Therapy

## 2019-12-13 DIAGNOSIS — R29898 Other symptoms and signs involving the musculoskeletal system: Secondary | ICD-10-CM

## 2019-12-13 DIAGNOSIS — M25511 Pain in right shoulder: Secondary | ICD-10-CM | POA: Diagnosis not present

## 2019-12-13 DIAGNOSIS — M25611 Stiffness of right shoulder, not elsewhere classified: Secondary | ICD-10-CM

## 2019-12-13 NOTE — Therapy (Signed)
Jacqueline [name], [place], [address] Phone: [phone]   Fax:  [phone]  Occupational Therapy Treatment  Patient Details  Name: Jacqueline [name] MRN: [id number] Date of Birth: [birth year] Referring Provider (OT): Dr. Arther Abbott   Encounter Date: 12/13/2019  OT End of Session - 12/13/19 1204    Visit Number  6    Number of Visits  16    Date for OT Re-Evaluation  01/26/20   Mini-reassessment 12/28/2019   Authorization Type  [employer]    Authorization Time Period  $25 copay, 60 visit limit    Authorization - Visit Number  6    Authorization - Number of Visits  60    OT Start Time  1119    OT Stop Time  1200    OT Time Calculation (min)  41 min    Activity Tolerance  Patient tolerated treatment well    Behavior During Therapy  [facility] for tasks assessed/performed       Past Medical History:  Diagnosis Date  . Arthritis   . Asthma   . Cataract   . Complication of anesthesia    Fever after all surgeries  . Depression   . Joint pain   . Nausea   . Smoker     Past Surgical History:  Procedure Laterality Date  . ABDOMINAL HYSTERECTOMY  2000  . APPENDECTOMY  1981  . CATARACT EXTRACTION W/ INTRAOCULAR LENS IMPLANT Left   . SHOULDER ARTHROSCOPY WITH ROTATOR CUFF REPAIR Right 11/15/2019   Procedure: SHOULDER ARTHROSCOPY WITH ROTATOR CUFF REPAIR;  Surgeon: Carole Civil, MD;  Location: [facility];  Service: Orthopedics;  Laterality: Right;  . TONSILLECTOMY     59 years old    There were no vitals filed for this visit.  Subjective Assessment - 12/13/19 1121    Subjective   S:  I felt like I had a pinched nerve in my neck the other night.    Currently in Pain?  Yes    Pain Score  3     Pain Location  Shoulder    Pain Orientation  Right    Pain Descriptors / Indicators  Aching;Sore    Pain Type  Acute pain    Pain Radiating Towards  to elbow    Pain Onset  In the past 7 days    Pain Frequency  Intermittent     Aggravating Factors   movement, exercise    Pain Relieving Factors  ice, pain medication    Effect of Pain on Daily Activities  max effect on ADL completion    Multiple Pain Sites  No         [facility] OT Assessment - 12/13/19 1121      Assessment   Medical Diagnosis  s/p right mini-open RCR with arthroscopy      Precautions   Precautions  Shoulder    Type of Shoulder Precautions  See protocol: 2/18-3/18: P/ROM;  3/19: AA/ROM & progress as tolerated                OT Treatments/Exercises (OP) - 12/13/19 1122      Exercises   Exercises  Shoulder      Shoulder Exercises: Supine   Protraction  PROM;10 reps;AAROM;5 reps    Horizontal ABduction  PROM;10 reps;AAROM;5 reps    External Rotation  PROM;10 reps;AAROM;5 reps    Internal Rotation  PROM;10 reps;AAROM;5 reps    Flexion  PROM;10 reps;AAROM;5 reps  ABduction  PROM;10 reps;AAROM;5 reps      Shoulder Exercises: Pulleys   Flexion  1 minute    Scaption  1 minute      Shoulder Exercises: Isometric Strengthening   Flexion  --   standing 3X10"   Extension  --   standing 3X10"   External Rotation  --   standing 3X10"   Internal Rotation  --   standing 3X10"   ABduction  --   standing 3X10"   ADduction  --   standing 3X10"     Manual Therapy   Manual Therapy  Myofascial release    Manual therapy comments  completed separately from therapeutic exercises    Myofascial Release  Myofascial release and manual techniques to right upper arm, trapezius, and scapular regions to decrease pain and fascial restrictions and increase joint ROM             OT Education - 12/13/19 1145    Education Details  standing isometrics    Person(s) Educated  Patient    Methods  Explanation;Demonstration;Handout    Comprehension  Verbalized understanding;Returned demonstration       OT Short Term Goals - 11/30/19 1718      OT SHORT TERM GOAL #1   Title  Pt will be provided with and educated on HEP to improve mobility  required for RUE use during ADLs.    Time  4    Period  Weeks    Status  On-going    Target Date  12/27/19      OT SHORT TERM GOAL #2   Title  Pt will increase RUE P/ROM to [facility] to improve ability to perform dressing tasks with minimal compensatory techniques.    Time  4    Period  Weeks    Status  On-going      OT SHORT TERM GOAL #3   Title  Pt will increase RUE strength to 3+/5 to improve ability to use RUE for tasks completed at waist to chest height.    Time  4    Period  Weeks    Status  On-going        OT Long Term Goals - 11/30/19 1718      OT LONG TERM GOAL #1   Title  Pt will return to highest level of functioning during ADL and leisure tasks using RUE as non-dominant.    Time  8    Period  Weeks    Status  On-going      OT LONG TERM GOAL #2   Title  Pt will decrease pain in RUE to 3/10 or less to improve ability to sleep for 4 consecutive hours or greater.    Time  8    Period  Weeks    Status  On-going      OT LONG TERM GOAL #3   Title  Pt will increase RUE A/ROM to [facility] to improve ability to perform reaching tasks overhead and behind back.    Time  8    Period  Weeks    Status  On-going      OT LONG TERM GOAL #4   Title  Pt will decrease fascial restrictions in RUE to minimal amounts or less to improve mobility required for functional reaching tasks.    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    Time  8    Period  Weeks    Status  On-going            Plan - 12/13/19 1204    Clinical Impression Statement  A: Pt reports decreased pain since last session, she has been using heat during the day and ice at night. Continued with manual therapy, decreased fascial restrictions in upper arm today. Progress to AA/ROM in supine and added pulleys today. Pt continues to have max  difficulty with tolerance to abduction, improves when completed as scaption. Verbal cuing for form and technique.    Body Structure / Function / Physical Skills  ADL;Endurance;Muscle spasms;UE functional use;Fascial restriction;Pain;ROM;IADL;Strength    Plan  P: Follow up on HEP, continue with AA/ROM progressing to 10 repetitions, continue with pulleys working on improving tolerance to abduction.    OT Home Exercise Plan  11/27/19: table slides; 12/06/19: scapular A/ROM extension and row; 3/18: standing isometrics    Consulted and Agree with Plan of Care  Patient       Patient will benefit from skilled therapeutic intervention in order to improve the following deficits and impairments:   Body Structure / Function / Physical Skills: ADL, Endurance, Muscle spasms, UE functional use, Fascial restriction, Pain, ROM, IADL, Strength       Visit Diagnosis: Acute pain of right shoulder  Stiffness of right shoulder, not elsewhere classified  Other symptoms and signs involving the musculoskeletal system    Problem List Patient Active Problem List   Diagnosis Date Noted  . S/P rotator cuff repair right 11/15/19 11/20/2019  . Nontraumatic incomplete tear of right rotator cuff   . Elevated hemoglobin A1c 12/26/2018  . Centrilobular emphysema (HCC) 12/12/2018  . Moderate recurrent major depression (HCC) 12/12/2018  . Elevated BP without diagnosis of hypertension 12/12/2018  . Hyperlipidemia 02/18/2017  . Psoriasis 11/11/2016  . Mild intermittent asthma 10/31/2015  . History of lymphadenitis 10/31/2015  . Tobacco abuse 10/31/2015  . Vitamin D deficiency 10/31/2015  . Allergic rhinitis due to pollen 10/31/2015    Ezra Sites, OTR/L  [phone] 12/13/2019, 12:06 PM  [facility] [address] [place], [place], [address] Phone: [phone]   Fax:  [phone]  Name: BELVIE IRIBE MRN: [id number] Date of Birth: August 05, 1961

## 2019-12-13 NOTE — Patient Instructions (Signed)
  Complete the following 3X a day. Hold for 10 seconds. Complete 3X each.   1) SHOULDER - ISOMETRIC FLEXION  Gently push your fist forward into a wall with your elbow bent.    2) SHOULDER - ISOMETRIC EXTENSION  Gently push your a bent elbow back into a wall.    3) SHOULDER - ISOMETRIC INTERNAL ROTATION   Gently press your hand into a wall using the palm side of your hand.  Maintain a bent elbow the entire time.        4) SHOULDER - ISOMETRIC ADDUCTION  Gently push your elbow into the side of your body.   5) SHOULDER - ISOMETRIC ABDUCTION  Gently push your elbow out to the side into a wall with your elbow bent.

## 2019-12-18 ENCOUNTER — Encounter (HOSPITAL_COMMUNITY): Payer: Self-pay | Admitting: Occupational Therapy

## 2019-12-18 ENCOUNTER — Ambulatory Visit (HOSPITAL_COMMUNITY): Payer: 59 | Admitting: Occupational Therapy

## 2019-12-18 ENCOUNTER — Other Ambulatory Visit: Payer: Self-pay

## 2019-12-18 DIAGNOSIS — M25511 Pain in right shoulder: Secondary | ICD-10-CM | POA: Diagnosis not present

## 2019-12-18 DIAGNOSIS — R29898 Other symptoms and signs involving the musculoskeletal system: Secondary | ICD-10-CM

## 2019-12-18 DIAGNOSIS — M25611 Stiffness of right shoulder, not elsewhere classified: Secondary | ICD-10-CM

## 2019-12-18 NOTE — Therapy (Signed)
[facility] [address] [place], [place], [address] Phone: [phone]   Fax:  [phone]  Occupational Therapy Treatment  Patient Details  Name: Jacqueline [name] MRN: [id number] Date of Birth: 1960/10/20 Referring Provider (OT): Dr. Fuller Canada   Encounter Date: 12/18/2019  OT End of Session - 12/18/19 1112    Visit Number  7    Number of Visits  16    Date for OT Re-Evaluation  01/26/20   Mini-reassessment 12/28/2019   Authorization Type  [employer]    Authorization Time Period  $25 copay, 60 visit limit    Authorization - Visit Number  7    Authorization - Number of Visits  60    OT Start Time  1028    OT Stop Time  1110    OT Time Calculation (min)  42 min    Activity Tolerance  Patient tolerated treatment well    Behavior During Therapy  [facility] for tasks assessed/performed       Past Medical History:  Diagnosis Date  . Arthritis   . Asthma   . Cataract   . Complication of anesthesia    Fever after all surgeries  . Depression   . Joint pain   . Nausea   . Smoker     Past Surgical History:  Procedure Laterality Date  . ABDOMINAL HYSTERECTOMY  2000  . APPENDECTOMY  1981  . CATARACT EXTRACTION W/ INTRAOCULAR LENS IMPLANT Left   . SHOULDER ARTHROSCOPY WITH ROTATOR CUFF REPAIR Right 11/15/2019   Procedure: SHOULDER ARTHROSCOPY WITH ROTATOR CUFF REPAIR;  Surgeon: Vickki Hearing, MD;  Location: [facility];  Service: Orthopedics;  Laterality: Right;  . TONSILLECTOMY     59 years old    There were no vitals filed for this visit.  Subjective Assessment - 12/18/19 1027    Subjective   S: It was mostly my bicep that was sore this morning.    Currently in Pain?  Yes    Pain Score  3     Pain Location  Shoulder    Pain Orientation  Right    Pain Descriptors / Indicators  Aching;Sore    Pain Type  Acute pain    Pain Radiating Towards  to elbow    Pain Onset  In the past 7 days    Pain Frequency  Intermittent    Aggravating Factors   movement, exercise    Pain Relieving Factors  ice, pain medication    Effect of Pain on Daily Activities  mod effect on ADLs         [facility] OT Assessment - 12/18/19 1027      Assessment   Medical Diagnosis  s/p right mini-open RCR with arthroscopy      Precautions   Precautions  Shoulder    Type of Shoulder Precautions  See protocol: 2/18-3/18: P/ROM;  3/19: AA/ROM & progress as tolerated                OT Treatments/Exercises (OP) - 12/18/19 1030      Exercises   Exercises  Shoulder      Shoulder Exercises: Supine   Protraction  PROM;5 reps;AAROM;10 reps    Horizontal ABduction  PROM;5 reps;AAROM;10 reps    External Rotation  PROM;5 reps;AAROM;10 reps    Internal Rotation  PROM;5 reps;AAROM;10 reps    Flexion  PROM;5 reps;AAROM;10 reps    ABduction  PROM;5 reps;AAROM;10 reps      Shoulder Exercises: Pulleys  Flexion  1 minute    Scaption  1 minute      Shoulder Exercises: ROM/Strengthening   Wall Wash  1' low level    Thumb Tacks  1' shoulder height      Manual Therapy   Manual Therapy  Myofascial release    Manual therapy comments  completed separately from therapeutic exercises    Myofascial Release  Myofascial release and manual techniques to right upper arm, trapezius, and scapular regions to decrease pain and fascial restrictions and increase joint ROM               OT Short Term Goals - 11/30/19 1718      OT SHORT TERM GOAL #1   Title  Pt will be provided with and educated on HEP to improve mobility required for RUE use during ADLs.    Time  4    Period  Weeks    Status  On-going    Target Date  12/27/19      OT SHORT TERM GOAL #2   Title  Pt will increase RUE P/ROM to [facility] to improve ability to perform dressing tasks with minimal compensatory techniques.    Time  4    Period  Weeks    Status  On-going      OT SHORT TERM GOAL #3   Title  Pt will increase RUE strength to 3+/5 to improve ability to use RUE for  tasks completed at waist to chest height.    Time  4    Period  Weeks    Status  On-going        OT Long Term Goals - 11/30/19 1718      OT LONG TERM GOAL #1   Title  Pt will return to highest level of functioning during ADL and leisure tasks using RUE as non-dominant.    Time  8    Period  Weeks    Status  On-going      OT LONG TERM GOAL #2   Title  Pt will decrease pain in RUE to 3/10 or less to improve ability to sleep for 4 consecutive hours or greater.    Time  8    Period  Weeks    Status  On-going      OT LONG TERM GOAL #3   Title  Pt will increase RUE A/ROM to [facility] to improve ability to perform reaching tasks overhead and behind back.    Time  8    Period  Weeks    Status  On-going      OT LONG TERM GOAL #4   Title  Pt will decrease fascial restrictions in RUE to minimal amounts or less to improve mobility required for functional reaching tasks.    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    Time  8    Period  Weeks    Status  On-going            Plan - 12/18/19 1112    Clinical Impression Statement  A: Pt reporting continued pain at night, hasn't used heat over the weekend very much. Continued with manual techniques and passive stretching, pt continues to have guarding above 50% ROM. Progressed AA/ROM  to 10 repetitions in supine, added wall wash this session and completed thumb tacks at shoulder height. Abduction continues to be the most difficulty motion for pt. Verbal cuing for form and technique.    Body Structure / Function / Physical Skills  ADL;Endurance;Muscle spasms;UE functional use;Fascial restriction;Pain;ROM;IADL;Strength    Plan  P: Continue with AA/ROM and add to HEP in supine, continue with pulleys and wall wash, attempt PVC pipe slide       Patient will benefit from  skilled therapeutic intervention in order to improve the following deficits and impairments:   Body Structure / Function / Physical Skills: ADL, Endurance, Muscle spasms, UE functional use, Fascial restriction, Pain, ROM, IADL, Strength       Visit Diagnosis: Acute pain of right shoulder  Stiffness of right shoulder, not elsewhere classified  Other symptoms and signs involving the musculoskeletal system    Problem List Patient Active Problem List   Diagnosis Date Noted  . S/P rotator cuff repair right 11/15/19 11/20/2019  . Nontraumatic incomplete tear of right rotator cuff   . Elevated hemoglobin A1c 12/26/2018  . Centrilobular emphysema (HCC) 12/12/2018  . Moderate recurrent major depression (HCC) 12/12/2018  . Elevated BP without diagnosis of hypertension 12/12/2018  . Hyperlipidemia 02/18/2017  . Psoriasis 11/11/2016  . Mild intermittent asthma 10/31/2015  . History of lymphadenitis 10/31/2015  . Tobacco abuse 10/31/2015  . Vitamin D deficiency 10/31/2015  . Allergic rhinitis due to pollen 10/31/2015   Ezra Sites, OTR/L  [phone] 12/18/2019, 11:14 AM  [facility] [address] [place], [place], [address] Phone: [phone]   Fax:  [phone]  Name: Jacqueline [name] MRN: [id number] Date of Birth: 09-Oct-1960

## 2019-12-20 ENCOUNTER — Encounter (HOSPITAL_COMMUNITY): Payer: Self-pay | Admitting: Occupational Therapy

## 2019-12-20 ENCOUNTER — Other Ambulatory Visit: Payer: Self-pay

## 2019-12-20 ENCOUNTER — Ambulatory Visit (HOSPITAL_COMMUNITY): Payer: 59 | Admitting: Occupational Therapy

## 2019-12-20 DIAGNOSIS — M25611 Stiffness of right shoulder, not elsewhere classified: Secondary | ICD-10-CM

## 2019-12-20 DIAGNOSIS — R29898 Other symptoms and signs involving the musculoskeletal system: Secondary | ICD-10-CM

## 2019-12-20 DIAGNOSIS — M25511 Pain in right shoulder: Secondary | ICD-10-CM

## 2019-12-20 NOTE — Patient Instructions (Signed)

## 2019-12-20 NOTE — Therapy (Signed)
[facility] [address] [place], [place], [address] Phone: [phone]   Fax:  [phone]  Occupational Therapy Treatment (Mini-reassessment)  Patient Details  Name: Jacqueline [name] MRN: [id number] Date of Birth: 1960/11/27 Referring Provider (OT): Dr. Fuller Canada   Encounter Date: 12/20/2019  OT End of Session - 12/20/19 0934    Visit Number  8    Number of Visits  16    Date for OT Re-Evaluation  01/26/20    Authorization Type  [employer]    Authorization Time Period  $25 copay, 60 visit limit    Authorization - Visit Number  8    Authorization - Number of Visits  60    OT Start Time  [phone]    OT Stop Time  0933    OT Time Calculation (min)  42 min    Activity Tolerance  Patient tolerated treatment well    Behavior During Therapy  [facility] for tasks assessed/performed       Past Medical History:  Diagnosis Date  . Arthritis   . Asthma   . Cataract   . Complication of anesthesia    Fever after all surgeries  . Depression   . Joint pain   . Nausea   . Smoker     Past Surgical History:  Procedure Laterality Date  . ABDOMINAL HYSTERECTOMY  2000  . APPENDECTOMY  1981  . CATARACT EXTRACTION W/ INTRAOCULAR LENS IMPLANT Left   . SHOULDER ARTHROSCOPY WITH ROTATOR CUFF REPAIR Right 11/15/2019   Procedure: SHOULDER ARTHROSCOPY WITH ROTATOR CUFF REPAIR;  Surgeon: Vickki Hearing, MD;  Location: [facility];  Service: Orthopedics;  Laterality: Right;  . TONSILLECTOMY     59 years old    There were no vitals filed for this visit.  Subjective Assessment - 12/20/19 0850    Subjective   S: I stretched it and heated it this morning.    Currently in Pain?  Yes    Pain Score  1     Pain Location  Shoulder    Pain Orientation  Right    Pain Descriptors / Indicators  Aching;Sore    Pain Type  Acute pain    Pain Radiating Towards  to elbow    Pain Onset  In the past 7 days    Pain Frequency  Intermittent    Aggravating Factors    movement, exercise    Pain Relieving Factors  ice, pain medication    Effect of Pain on Daily Activities  mod effect on ADLs    Multiple Pain [name]  No         [facility] OT Assessment - 12/20/19 0850      Assessment   Medical Diagnosis  s/p right mini-open RCR with arthroscopy      Precautions   Precautions  Shoulder    Type of Shoulder Precautions  See protocol: 2/18-3/18: P/ROM;  3/19: AA/ROM & progress as tolerated       Palpation   Palpation comment  mod fascial restrictions in right upper arm, trapezius, and scapular regions      AROM   Overall AROM Comments  Assessed seated, er/IR adducted    AROM Assessment Site  Shoulder    Right/Left Shoulder  Right    Right Shoulder Flexion  98 Degrees   not previously assessed   Right Shoulder ABduction  70 Degrees   not previously assessed   Right Shoulder Internal Rotation  90 Degrees   not previously  assessed   Right Shoulder External Rotation  65 Degrees   not previously assessed     PROM   Overall PROM Comments  Assessed supine, er/IR adducted    PROM Assessment Site  Shoulder    Right/Left Shoulder  Right    Right Shoulder Flexion  142 Degrees   101 previous   Right Shoulder ABduction  100 Degrees   65 previous   Right Shoulder Internal Rotation  90 Degrees   same as previous   Right Shoulder External Rotation  60 Degrees   32 previous     Strength   Overall Strength  Unable to assess;Due to precautions               OT Treatments/Exercises (OP) - 12/20/19 0854      Exercises   Exercises  Shoulder      Shoulder Exercises: Supine   Protraction  PROM;5 reps;AAROM;10 reps    Horizontal ABduction  PROM;5 reps;AAROM;10 reps    External Rotation  PROM;5 reps;AAROM;10 reps    Internal Rotation  PROM;5 reps;AAROM;10 reps    Flexion  PROM;5 reps;AAROM;10 reps    ABduction  PROM;5 reps;AAROM;10 reps      Shoulder Exercises: ROM/Strengthening   Wall Wash  1' low level    Other ROM/Strengthening Exercises   PVC pipe slide, 10X flexion      Manual Therapy   Manual Therapy  Myofascial release    Manual therapy comments  completed separately from therapeutic exercises    Myofascial Release  Myofascial release and manual techniques to right upper arm, trapezius, and scapular regions to decrease pain and fascial restrictions and increase joint ROM             OT Education - 12/20/19 0850    Education Details  AA/ROM in supine    Person(s) Educated  Patient    Methods  Explanation;Demonstration;Handout    Comprehension  Verbalized understanding;Returned demonstration       OT Short Term Goals - 11/30/19 1718      OT SHORT TERM GOAL #1   Title  Pt will be provided with and educated on HEP to improve mobility required for RUE use during ADLs.    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    Time  4    Period  Weeks    Status  On-going      OT SHORT TERM GOAL #3   Title  Pt will increase RUE strength to 3+/5 to improve ability to use RUE for tasks completed at waist to chest height.    Time  4    Period  Weeks    Status  On-going        OT Long Term Goals - 11/30/19 1718      OT LONG TERM GOAL #1   Title  Pt will return to highest level of functioning during ADL and leisure tasks using RUE as non-dominant.    Time  8    Period  Weeks    Status  On-going      OT LONG TERM GOAL #2   Title  Pt will decrease pain in RUE to 3/10 or less to improve ability to sleep for 4 consecutive  hours or greater.    Time  8    Period  Weeks    Status  On-going      OT LONG TERM GOAL #3   Title  Pt will increase RUE A/ROM to [facility] to improve ability to perform reaching tasks overhead and behind back.    Time  8    Period  Weeks    Status  On-going      OT LONG TERM GOAL #4   Title  Pt will decrease fascial  restrictions in RUE to minimal amounts or less to improve mobility required for functional reaching tasks.    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    Time  8    Period  Weeks    Status  On-going            Plan - 12/20/19 7353    Clinical Impression Statement  A: Mini-reassessment/measurements taken for MD appt tomorrow. Pt primarily limited by pain in elbow immediately proximal to olecranon, ROM improves with firm pressure to the area. Pt demonstrates improved ROM, pain, and fascial restrictions since evaluation. Continued with manual techniques this session, passive stretching with pressure to elbow region to improve tolerance. Continued with AA/ROM and added to HEP with instructions to complete in supine. Added pvc pipe slide which pt was able to gain ROM ~75%. Verbal cuing for form and technique during exercises.    Body Structure / Function / Physical Skills  ADL;Endurance;Muscle spasms;UE functional use;Fascial restriction;Pain;ROM;IADL;Strength    Plan  P: Follow up on MD appt. Follow up on HEP. Progress to AA/ROM in standing    OT Home Exercise Plan  11/27/19: table slides; 12/06/19: scapular A/ROM extension and row; 3/18: standing isometrics; 3/25: AA/ROM exercises    Consulted and Agree with Plan of Care  Patient       Patient will benefit from skilled therapeutic intervention in order to improve the following deficits and impairments:   Body Structure / Function / Physical Skills: ADL, Endurance, Muscle spasms, UE functional use, Fascial restriction, Pain, ROM, IADL, Strength       Visit Diagnosis: Acute pain of right shoulder  Stiffness of right shoulder, not elsewhere classified  Other symptoms and signs involving the musculoskeletal system    Problem List Patient Active Problem List    Diagnosis Date Noted  . S/P rotator cuff repair right 11/15/19 11/20/2019  . Nontraumatic incomplete tear of right rotator cuff   . Elevated hemoglobin A1c 12/26/2018  . Centrilobular emphysema (HCC) 12/12/2018  . Moderate recurrent major depression (HCC) 12/12/2018  . Elevated BP without diagnosis of hypertension 12/12/2018  . Hyperlipidemia 02/18/2017  . Psoriasis 11/11/2016  . Mild intermittent asthma 10/31/2015  . History of lymphadenitis 10/31/2015  . Tobacco abuse 10/31/2015  . Vitamin D deficiency 10/31/2015  . Allergic rhinitis due to pollen 10/31/2015   Jacqueline [name], Jacqueline [name]  [phone] 12/20/2019, 9:36 AM  [facility] [address] [place], [place], [address] Phone: [phone]   Fax:  [phone]  Name: Jacqueline [name] MRN: [id number] Date of Birth: [birth year]

## 2019-12-21 ENCOUNTER — Ambulatory Visit (INDEPENDENT_AMBULATORY_CARE_PROVIDER_SITE_OTHER): Payer: 59 | Admitting: Orthopedic Surgery

## 2019-12-21 ENCOUNTER — Encounter: Payer: Self-pay | Admitting: Orthopedic Surgery

## 2019-12-21 DIAGNOSIS — Z9889 Other specified postprocedural states: Secondary | ICD-10-CM

## 2019-12-21 DIAGNOSIS — M25521 Pain in right elbow: Secondary | ICD-10-CM

## 2019-12-21 MED ORDER — TIZANIDINE HCL 4 MG PO TABS: 4.0000 mg | ORAL_TABLET | Freq: Three times a day (TID) | ORAL | 1 refills | Status: DC

## 2019-12-21 MED ORDER — OXYCODONE-ACETAMINOPHEN 5-325 MG PO TABS: 1.0000 | ORAL_TABLET | Freq: Four times a day (QID) | ORAL | 0 refills | Status: AC | PRN

## 2019-12-21 NOTE — Progress Notes (Signed)
Encounter Diagnoses  Name Primary?  . S/P rotator cuff repair right 11/15/19 Yes  . Pain in right elbow     Chief Complaint  Patient presents with  . Routine Post Op    11/15/19 right rotator cuff repair   . Elbow Problem    has right elbow pain with therapy     Status post rotator cuff repair right shoulder on February 18 this is postop day #38 Making continued progress right shoulder  New complaint C/o elbow pain  Tender lateral elbow   Traps are tight   Inject right elbow  Procedure note injection for right tennis elbow   Diagnosis right tennis elbow  Anesthesia ethyl chloride was used Alcohol use is clean the skin  After we obtained verbal consent and timeout a 25-gauge needle was used to inject 40 mg of Depo-Medrol and 3 cc of 1% lidocaine just distal to the insertion of the ECRB  There were no complications and a sterile bandage was applied.  Fu 6 weeks   Meds ordered this encounter  Medications  . oxyCODONE-acetaminophen (PERCOCET/ROXICET) 5-325 MG tablet    Sig: Take 1 tablet by mouth every 6 (six) hours as needed for up to 7 days for severe pain.    Dispense:  28 tablet    Refill:  0  . tiZANidine (ZANAFLEX) 4 MG tablet    Sig: Take 1 tablet (4 mg total) by mouth 3 (three) times daily.    Dispense:  30 tablet    Refill:  1

## 2019-12-25 ENCOUNTER — Encounter (HOSPITAL_COMMUNITY): Payer: Self-pay | Admitting: Occupational Therapy

## 2019-12-25 ENCOUNTER — Other Ambulatory Visit: Payer: Self-pay

## 2019-12-25 ENCOUNTER — Ambulatory Visit (HOSPITAL_COMMUNITY): Payer: 59 | Admitting: Occupational Therapy

## 2019-12-25 DIAGNOSIS — R29898 Other symptoms and signs involving the musculoskeletal system: Secondary | ICD-10-CM

## 2019-12-25 DIAGNOSIS — M25511 Pain in right shoulder: Secondary | ICD-10-CM | POA: Diagnosis not present

## 2019-12-25 DIAGNOSIS — M25611 Stiffness of right shoulder, not elsewhere classified: Secondary | ICD-10-CM

## 2019-12-25 IMAGING — MG DIGITAL DIAGNOSTIC BILATERAL MAMMOGRAM WITH TOMO AND CAD
6 of 10 series · 6 of 30 positions shown · non-contrast
Comparison: Previous exam(s).

CLINICAL DATA: Patient presents for evaluation of intermittent
scabbing along the right areola.

EXAM:
DIGITAL DIAGNOSTIC BILATERAL MAMMOGRAM WITH CAD AND TOMO
ULTRASOUND BILATERAL BREAST

[R CC synth-2D]
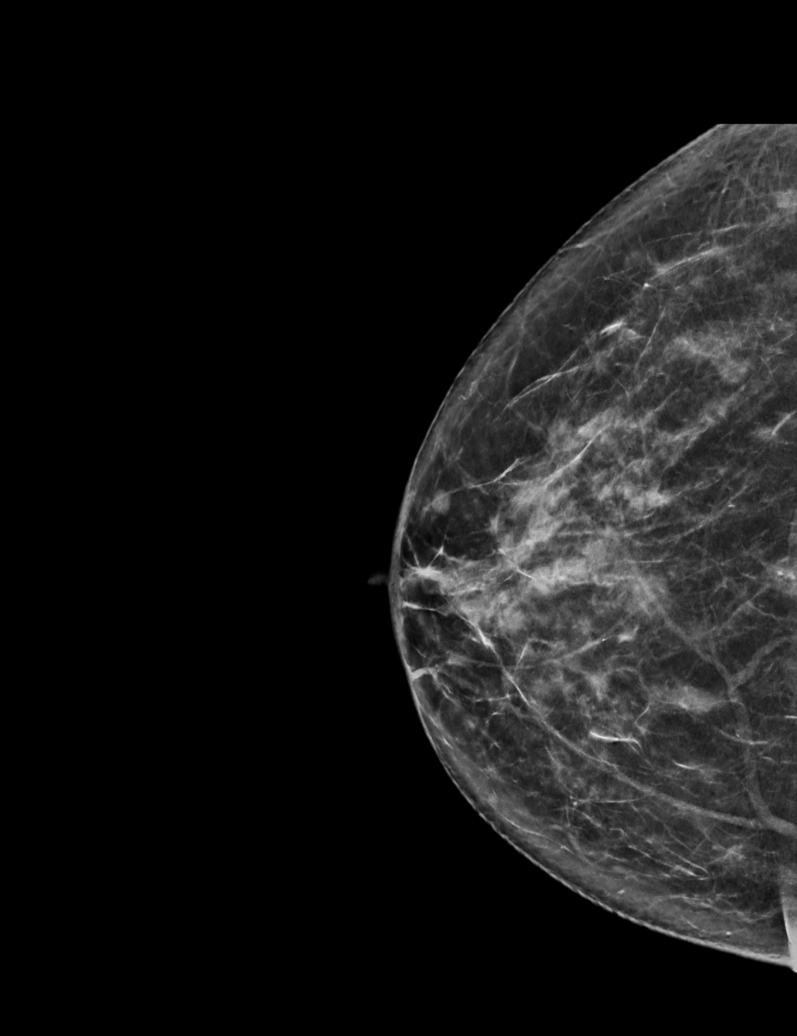

[L CC synth-2D]
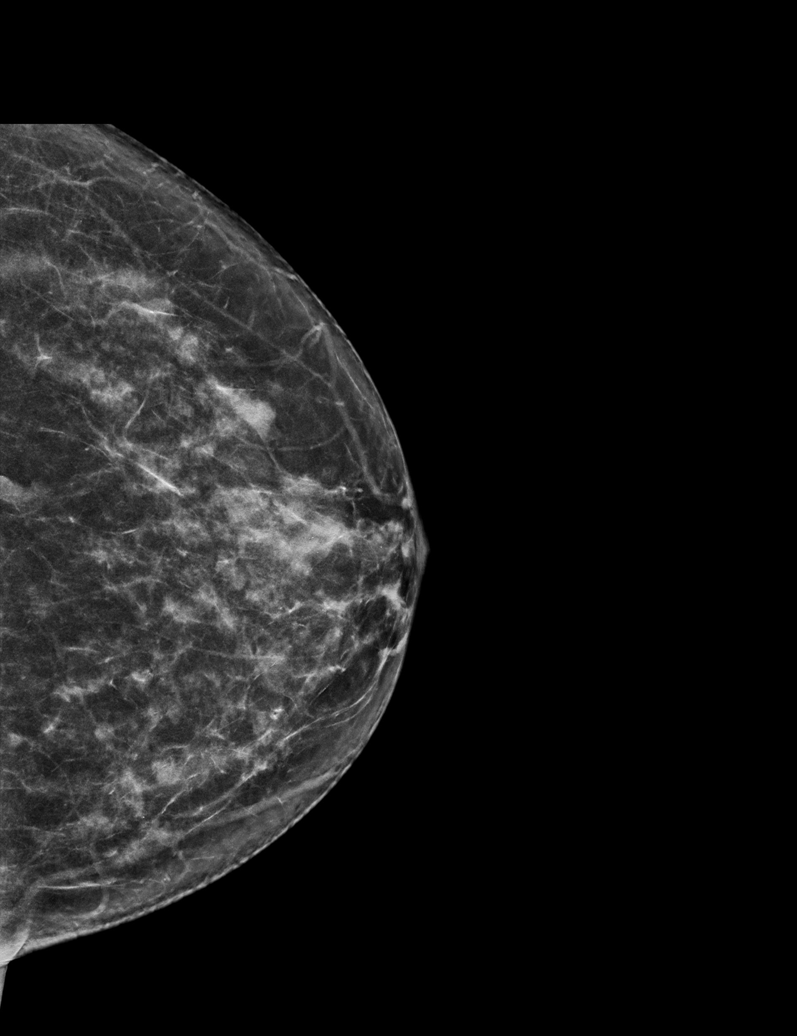

[R MLO synth-2D (1 of 2)]
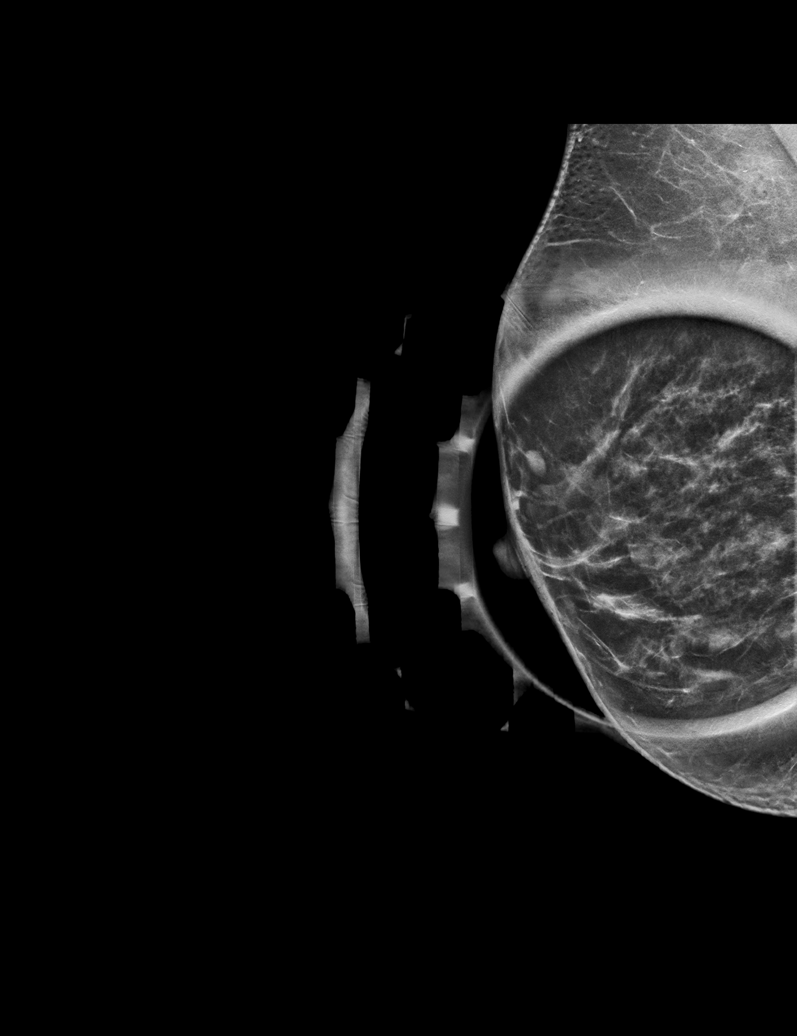

[L MLO synth-2D]
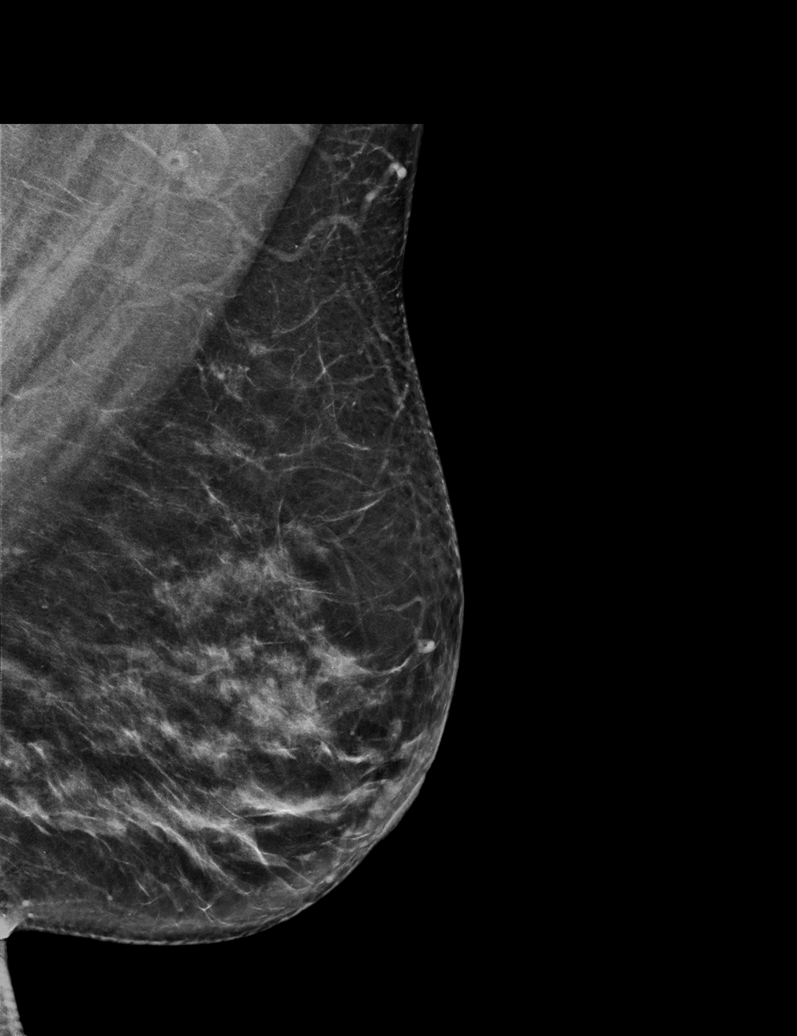

[R MLO synth-2D (2 of 2)]
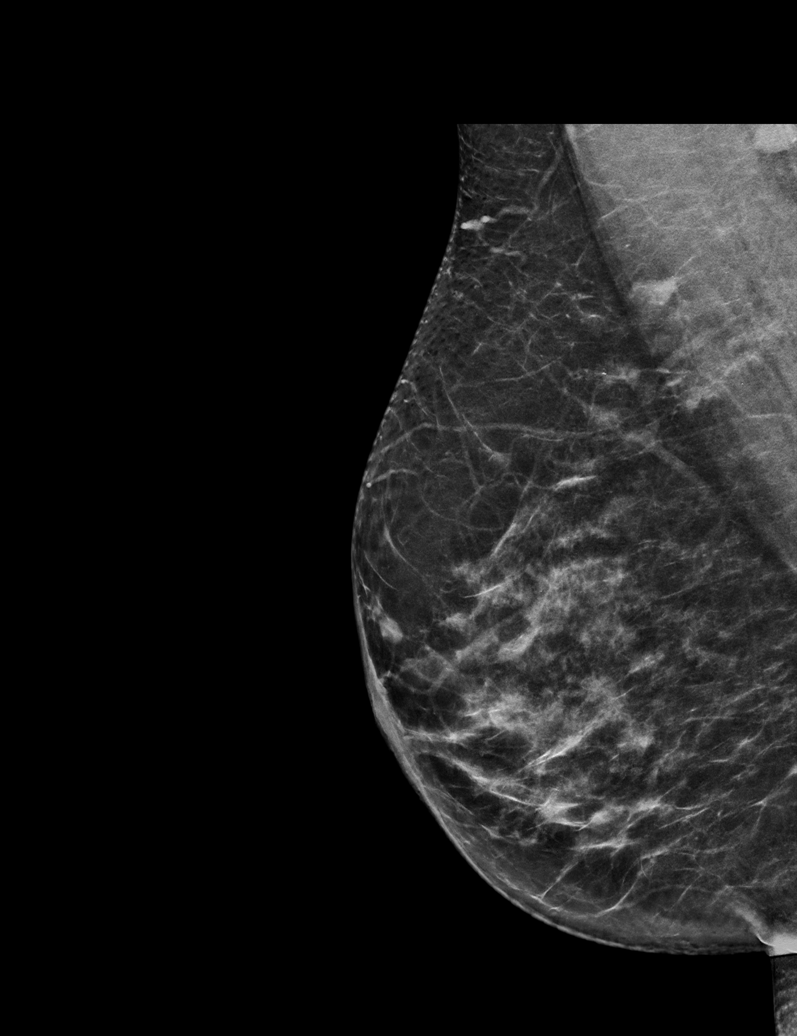

[L MLO tomo · tomo slice 36/71.0]
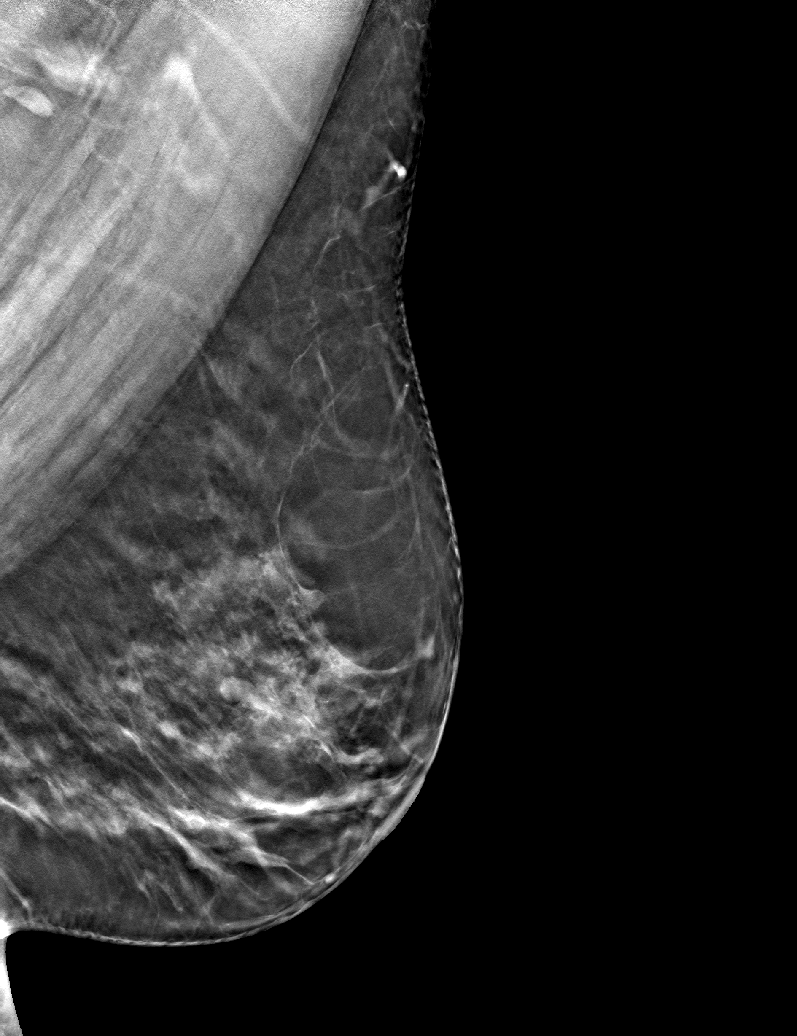

[6 of 30 positions shown; findings below may reference images not displayed]

ACR Breast Density Category c: The breast tissue is heterogeneously
dense, which may obscure small masses.
FINDINGS: Multiple bilateral oval circumscribed masses are demonstrated
compatible with cysts. Additionally within the posterior left breast
on the CC view there is a new oval circumscribed mass. No additional
suspicious calcifications or distortion identified within either
breast.

Mammographic images were processed with CAD.

On physical exam, no discrete scabbing is identified on the right
areola.

Targeted ultrasound is performed, showing mildly dilated ducts
within the retroareolar right breast without intraductal mass.

Within the left breast 6 o'clock position 1 cm from nipple there is
a 7 x 6 x 4 mm oval circumscribed hypoechoic mass. This is felt to
correspond with mammographically identified mass.
IMPRESSION: 1. No suspicious abnormality within the retroareolar right breast.
2. Probably benign left breast mass 6 o'clock position.

RECOMMENDATION:
1. Continued clinical evaluation for intermittent scabbing of the
right areola.
2. Left breast diagnostic mammography and ultrasound in 6 months to
ensure stability of probably benign left breast mass.

I have discussed the findings and recommendations with the patient.
Results were also provided in writing at the conclusion of the
visit. If applicable, a reminder letter will be sent to the patient
regarding the next appointment.

BI-RADS CATEGORY  3: Probably benign.

## 2019-12-25 IMAGING — US ULTRASOUND LEFT BREAST LIMITED
1 series · 4 of 4 positions shown · non-contrast
Comparison: Previous exam(s).

CLINICAL DATA: Patient presents for evaluation of intermittent
scabbing along the right areola.

EXAM:
DIGITAL DIAGNOSTIC BILATERAL MAMMOGRAM WITH CAD AND TOMO
ULTRASOUND BILATERAL BREAST

[Series 1: ultrasound left breast limited · 0.06mm/px · 4 of 4 slices shown]
[im 1/4]
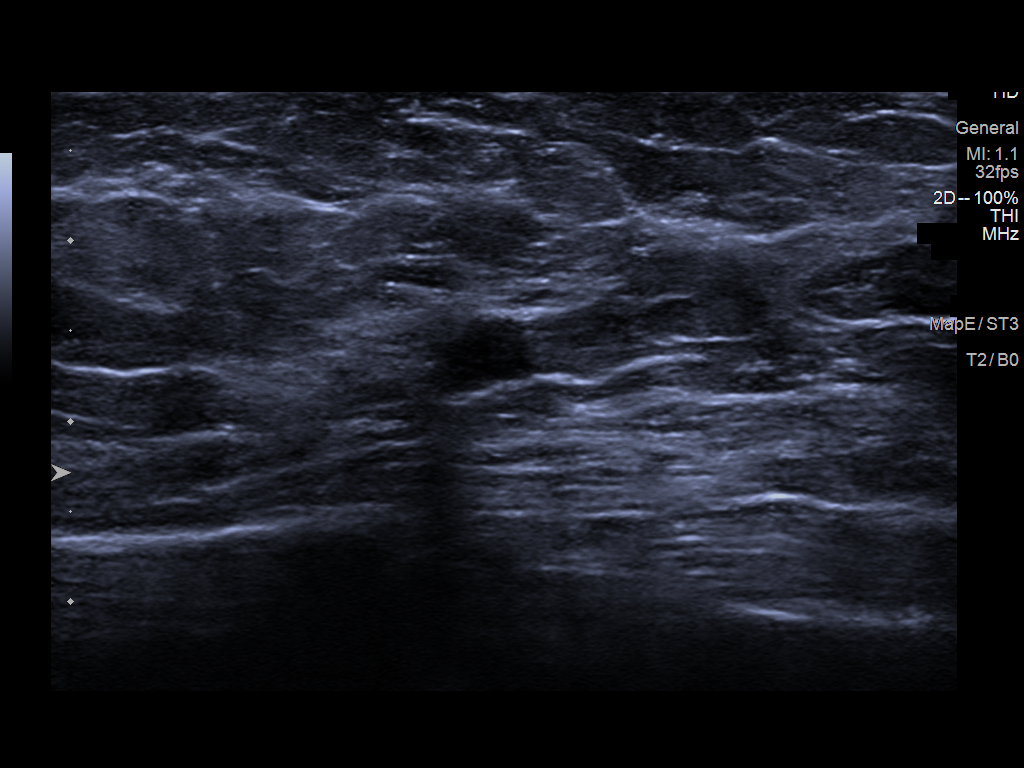
[im 2/4]
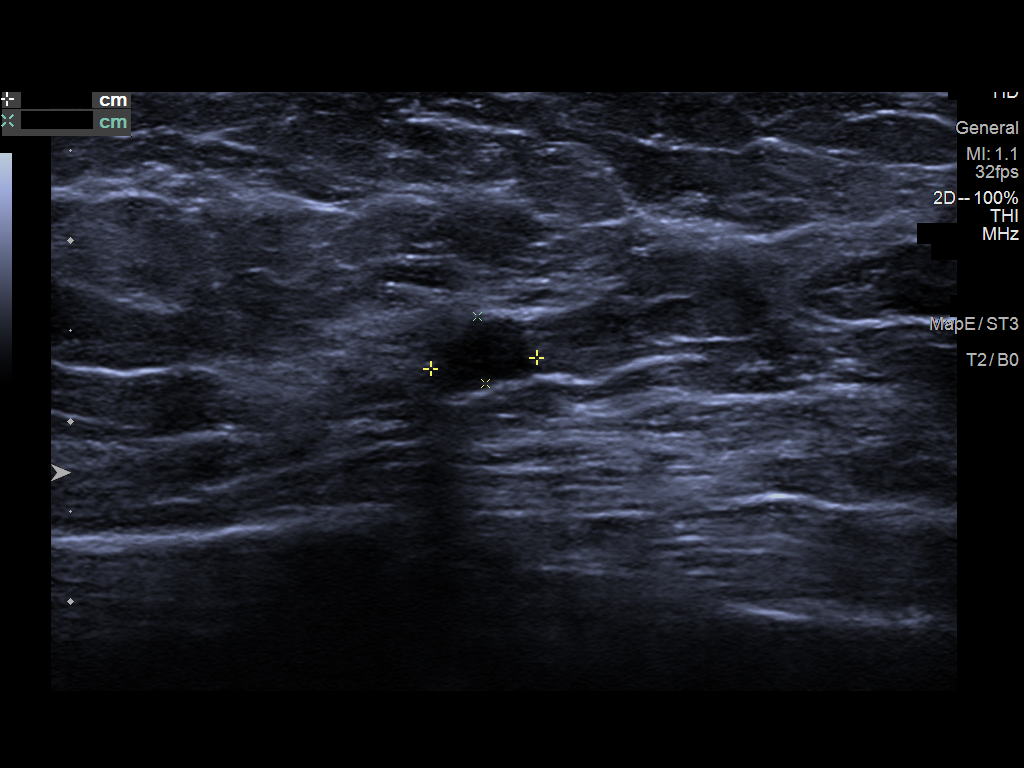
[im 3/4]
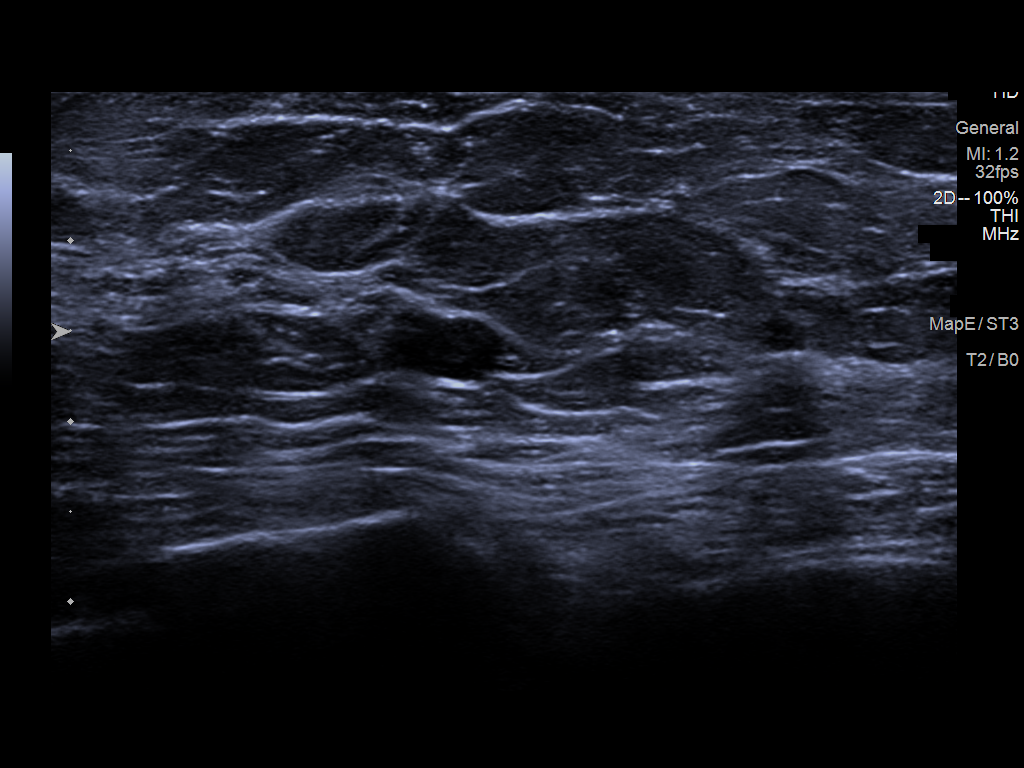
[im 4/4]
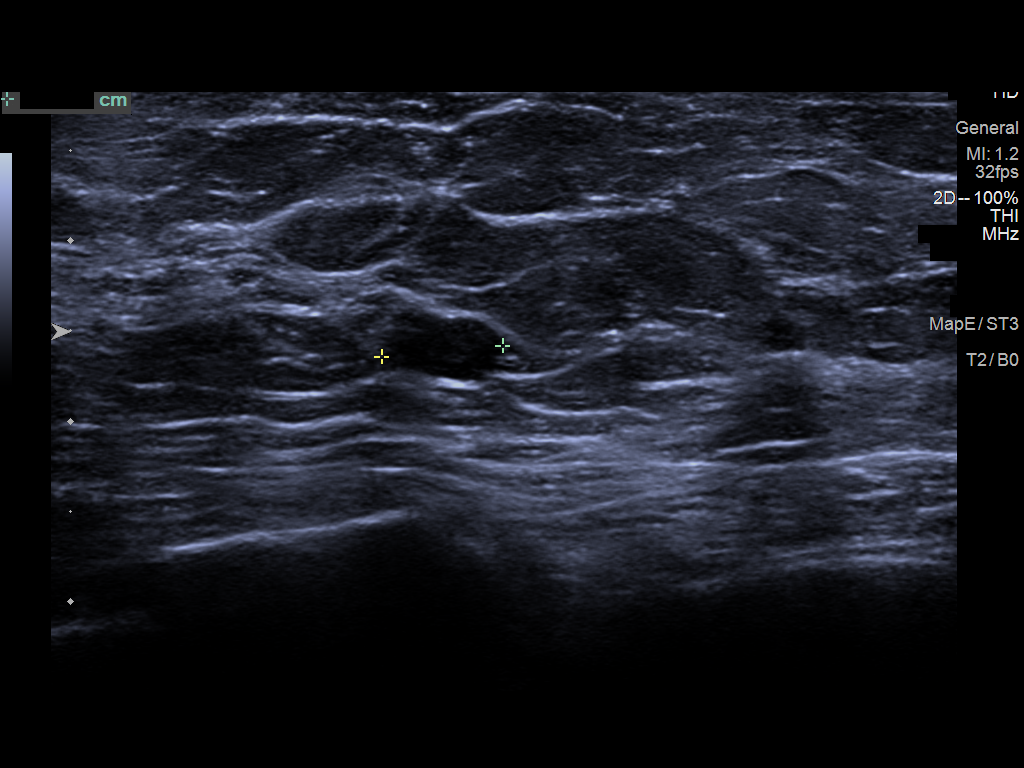

[4 of 4 positions shown; findings below may reference images not displayed]

ACR Breast Density Category c: The breast tissue is heterogeneously
dense, which may obscure small masses.
FINDINGS: Multiple bilateral oval circumscribed masses are demonstrated
compatible with cysts. Additionally within the posterior left breast
on the CC view there is a new oval circumscribed mass. No additional
suspicious calcifications or distortion identified within either
breast.

Mammographic images were processed with CAD.

On physical exam, no discrete scabbing is identified on the right
areola.

Targeted ultrasound is performed, showing mildly dilated ducts
within the retroareolar right breast without intraductal mass.

Within the left breast 6 o'clock position 1 cm from nipple there is
a 7 x 6 x 4 mm oval circumscribed hypoechoic mass. This is felt to
correspond with mammographically identified mass.
IMPRESSION: 1. No suspicious abnormality within the retroareolar right breast.
2. Probably benign left breast mass 6 o'clock position.

RECOMMENDATION:
1. Continued clinical evaluation for intermittent scabbing of the
right areola.
2. Left breast diagnostic mammography and ultrasound in 6 months to
ensure stability of probably benign left breast mass.

I have discussed the findings and recommendations with the patient.
Results were also provided in writing at the conclusion of the
visit. If applicable, a reminder letter will be sent to the patient
regarding the next appointment.

BI-RADS CATEGORY  3: Probably benign.

## 2019-12-25 NOTE — Therapy (Signed)
[place], [place], [address] Phone: [phone]   Fax:  [phone]  Occupational Therapy Treatment  Patient Details  Name: Jacqueline [name] MRN: [id number] Date of Birth: [birth year] Referring Provider (OT): Dr. Arther Abbott   Encounter Date: 12/25/2019  OT End of Session - 12/25/19 1110    Visit Number  9    Number of Visits  16    Date for OT Re-Evaluation  01/26/20    Authorization Type  [employer]    Authorization Time Period  $25 copay, 60 visit limit    Authorization - Visit Number  9    Authorization - Number of Visits  60    OT Start Time  1026    OT Stop Time  1107    OT Time Calculation (min)  41 min    Activity Tolerance  Patient tolerated treatment well    Behavior During Therapy  [facility] for tasks assessed/performed       Past Medical History:  Diagnosis Date  . Arthritis   . Asthma   . Cataract   . Complication of anesthesia    Fever after all surgeries  . Depression   . Joint pain   . Nausea   . Smoker     Past Surgical History:  Procedure Laterality Date  . ABDOMINAL HYSTERECTOMY  2000  . APPENDECTOMY  1981  . CATARACT EXTRACTION W/ INTRAOCULAR LENS IMPLANT Left   . SHOULDER ARTHROSCOPY WITH ROTATOR CUFF REPAIR Right 11/15/2019   Procedure: SHOULDER ARTHROSCOPY WITH ROTATOR CUFF REPAIR;  Surgeon: Carole Civil, MD;  Location: [facility];  Service: Orthopedics;  Laterality: Right;  . TONSILLECTOMY     59 years old    There were no vitals filed for this visit.  Subjective Assessment - 12/25/19 1025    Subjective   S: I got my first good nights sleep last night.    Currently in Pain?  Yes    Pain Score  1     Pain Location  Shoulder    Pain Orientation  Right    Pain Descriptors / Indicators  Aching;Sore    Pain Type  Acute pain    Pain Radiating Towards  to elbow    Pain Onset  In the past 7 days    Pain Frequency  Intermittent    Aggravating Factors   movement, exercise     Pain Relieving Factors  ice, pain medication    Effect of Pain on Daily Activities  mod effect on ADLs    Multiple Pain Sites  No         [facility] OT Assessment - 12/25/19 1025      Assessment   Medical Diagnosis  s/p right mini-open RCR with arthroscopy      Precautions   Precautions  Shoulder    Type of Shoulder Precautions  See protocol: 2/18-3/18: P/ROM;  3/19: AA/ROM & progress as tolerated                OT Treatments/Exercises (OP) - 12/25/19 1028      Exercises   Exercises  Shoulder      Shoulder Exercises: Supine   Protraction  PROM;5 reps;AAROM;10 reps    Horizontal ABduction  PROM;5 reps;AAROM;10 reps    External Rotation  PROM;5 reps;AAROM;10 reps    Internal Rotation  PROM;5 reps;AAROM;10 reps    Flexion  PROM;5 reps;AAROM;10 reps    ABduction  PROM;5 reps;AAROM;10 reps  Shoulder Exercises: Standing   Protraction  AAROM;10 reps    Horizontal ABduction  AAROM;10 reps    External Rotation  AAROM;10 reps    Internal Rotation  AAROM;10 reps    Flexion  AAROM;10 reps    ABduction  AAROM;10 reps      Shoulder Exercises: Pulleys   Flexion  1 minute    Scaption  1 minute      Shoulder Exercises: ROM/Strengthening   Wall Wash  1' low level    Other ROM/Strengthening Exercises  PVC pipe slide, 10X flexion      Manual Therapy   Manual Therapy  Myofascial release    Manual therapy comments  completed separately from therapeutic exercises    Myofascial Release  Myofascial release and manual techniques to right upper arm, trapezius, and scapular regions to decrease pain and fascial restrictions and increase joint ROM               OT Short Term Goals - 11/30/19 1718      OT SHORT TERM GOAL #1   Title  Pt will be provided with and educated on HEP to improve mobility required for RUE use during ADLs.    Time  4    Period  Weeks    Status  On-going    Target Date  12/27/19      OT SHORT TERM GOAL #2   Title  Pt will increase RUE P/ROM to  [facility] to improve ability to perform dressing tasks with minimal compensatory techniques.    Time  4    Period  Weeks    Status  On-going      OT SHORT TERM GOAL #3   Title  Pt will increase RUE strength to 3+/5 to improve ability to use RUE for tasks completed at waist to chest height.    Time  4    Period  Weeks    Status  On-going        OT Long Term Goals - 11/30/19 1718      OT LONG TERM GOAL #1   Title  Pt will return to highest level of functioning during ADL and leisure tasks using RUE as non-dominant.    Time  8    Period  Weeks    Status  On-going      OT LONG TERM GOAL #2   Title  Pt will decrease pain in RUE to 3/10 or less to improve ability to sleep for 4 consecutive hours or greater.    Time  8    Period  Weeks    Status  On-going      OT LONG TERM GOAL #3   Title  Pt will increase RUE A/ROM to [facility] to improve ability to perform reaching tasks overhead and behind back.    Time  8    Period  Weeks    Status  On-going      OT LONG TERM GOAL #4   Title  Pt will decrease fascial restrictions in RUE to minimal amounts or less to improve mobility required for functional reaching tasks.    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    Time  8    Period  Weeks  Status  On-going            Plan - 12/25/19 1049    Clinical Impression Statement  A: Pt reports MD gave her an injection in her elbow and told her to stop embroidering for a week and see if that helps. Continued with manual therapy, no pain in elbow during passive stretching today. Continued with AA/ROM in supine and progressed to standing. Pt continues to have the most difficulty with abduction over 90 degrees. Resumed pulleys with improved tolerance today. Verbal cuing for form and technique.    Body Structure / Function /  Physical Skills  ADL;Endurance;Muscle spasms;UE functional use;Fascial restriction;Pain;ROM;IADL;Strength    Plan  P: Continue with AA/ROM, attempt gentle abduction stretch    OT Home Exercise Plan  11/27/19: table slides; 12/06/19: scapular A/ROM extension and row; 3/18: standing isometrics; 3/25: AA/ROM exercises; 3/30: instructed to begin AA/ROM in standing    Consulted and Agree with Plan of Care  Patient       Patient will benefit from skilled therapeutic intervention in order to improve the following deficits and impairments:   Body Structure / Function / Physical Skills: ADL, Endurance, Muscle spasms, UE functional use, Fascial restriction, Pain, ROM, IADL, Strength       Visit Diagnosis: Acute pain of right shoulder  Stiffness of right shoulder, not elsewhere classified  Other symptoms and signs involving the musculoskeletal system    Problem List Patient Active Problem List   Diagnosis Date Noted  . S/P rotator cuff repair right 11/15/19 11/20/2019  . Nontraumatic incomplete tear of right rotator cuff   . Elevated hemoglobin A1c 12/26/2018  . Centrilobular emphysema (HCC) 12/12/2018  . Moderate recurrent major depression (HCC) 12/12/2018  . Elevated BP without diagnosis of hypertension 12/12/2018  . Hyperlipidemia 02/18/2017  . Psoriasis 11/11/2016  . Mild intermittent asthma 10/31/2015  . History of lymphadenitis 10/31/2015  . Tobacco abuse 10/31/2015  . Vitamin D deficiency 10/31/2015  . Allergic rhinitis due to pollen 10/31/2015   Ezra Sites, OTR/L  [phone] 12/25/2019, 11:12 AM   [facility] [address] [place], [place], [address] Phone: [phone]   Fax:  [phone]  Name: Jacqueline [name] MRN: [id number] Date of Birth: 1961/07/21

## 2019-12-27 ENCOUNTER — Encounter (HOSPITAL_COMMUNITY): Payer: 59 | Admitting: Occupational Therapy

## 2020-01-01 ENCOUNTER — Encounter (HOSPITAL_COMMUNITY): Payer: Self-pay | Admitting: Occupational Therapy

## 2020-01-01 ENCOUNTER — Ambulatory Visit (HOSPITAL_COMMUNITY): Payer: 59 | Attending: Orthopedic Surgery | Admitting: Occupational Therapy

## 2020-01-01 ENCOUNTER — Other Ambulatory Visit: Payer: Self-pay

## 2020-01-01 DIAGNOSIS — M25611 Stiffness of right shoulder, not elsewhere classified: Secondary | ICD-10-CM | POA: Insufficient documentation

## 2020-01-01 DIAGNOSIS — R29898 Other symptoms and signs involving the musculoskeletal system: Secondary | ICD-10-CM | POA: Insufficient documentation

## 2020-01-01 DIAGNOSIS — M25511 Pain in right shoulder: Secondary | ICD-10-CM | POA: Diagnosis present

## 2020-01-01 NOTE — Therapy (Signed)
[facility] [address] [place], [place], [address] Phone: [phone]   Fax:  [phone]  Occupational Therapy Treatment  Patient Details  Name: Jacqueline [name] MRN: [id number] Date of Birth: 1961/01/05 Referring Provider (OT): Dr. Fuller Canada   Encounter Date: 01/01/2020  OT End of Session - 01/01/20 1021    Visit Number  10    Number of Visits  16    Date for OT Re-Evaluation  01/26/20    Authorization Type  [employer]    Authorization Time Period  $25 copay, 60 visit limit    Authorization - Visit Number  10    Authorization - Number of Visits  60    OT Start Time  [phone]    OT Stop Time  1020    OT Time Calculation (min)  38 min    Activity Tolerance  Patient tolerated treatment well    Behavior During Therapy  [facility] for tasks assessed/performed       Past Medical History:  Diagnosis Date  . Arthritis   . Asthma   . Cataract   . Complication of anesthesia    Fever after all surgeries  . Depression   . Joint pain   . Nausea   . Smoker     Past Surgical History:  Procedure Laterality Date  . ABDOMINAL HYSTERECTOMY  2000  . APPENDECTOMY  1981  . CATARACT EXTRACTION W/ INTRAOCULAR LENS IMPLANT Left   . SHOULDER ARTHROSCOPY WITH ROTATOR CUFF REPAIR Right 11/15/2019   Procedure: SHOULDER ARTHROSCOPY WITH ROTATOR CUFF REPAIR;  Surgeon: Vickki Hearing, MD;  Location: [facility];  Service: Orthopedics;  Laterality: Right;  . TONSILLECTOMY     59 years old    There were no vitals filed for this visit.  Subjective Assessment - 01/01/20 0943    Subjective   S: During the day it's not bad but at night it's horrible.    Currently in Pain?  Yes    Pain Score  4     Pain Location  Shoulder    Pain Orientation  Right    Pain Descriptors / Indicators  Aching;Dull    Pain Type  Acute pain    Pain Radiating Towards  to elbow    Pain Onset  In the past 7 days    Pain Frequency  Intermittent    Aggravating Factors   movement,  exercise    Pain Relieving Factors  ice, pain medication    Effect of Pain on Daily Activities  mod effect on ADLs.    Multiple Pain Sites  No         [facility] OT Assessment - 01/01/20 0943      Assessment   Medical Diagnosis  s/p right mini-open RCR with arthroscopy      Precautions   Precautions  Shoulder    Type of Shoulder Precautions  See protocol: 2/18-3/18: P/ROM;  3/19: AA/ROM & progress as tolerated                OT Treatments/Exercises (OP) - 01/01/20 0944      Exercises   Exercises  Shoulder      Shoulder Exercises: Supine   Protraction  PROM;5 reps;AAROM;10 reps    Horizontal ABduction  PROM;5 reps;AAROM;10 reps    External Rotation  PROM;5 reps;AAROM;10 reps    Internal Rotation  PROM;5 reps;AAROM;10 reps    Flexion  PROM;5 reps;AAROM;10 reps    ABduction  PROM;5 reps;AAROM;10 reps  Shoulder Exercises: Standing   Protraction  AAROM;10 reps    Horizontal ABduction  AAROM;10 reps    External Rotation  AAROM;10 reps    Internal Rotation  AAROM;10 reps    Flexion  AAROM;10 reps    ABduction  AAROM;10 reps    Extension  Theraband;10 reps    Theraband Level (Shoulder Extension)  Level 2 (Red)    Row  Theraband;10 reps    Theraband Level (Shoulder Row)  Level 2 (Red)      Shoulder Exercises: Pulleys   Flexion  2 minutes    Scaption  2 minutes      Shoulder Exercises: ROM/Strengthening   Wall Wash  1'     Proximal Shoulder Strengthening, Supine  10X each, no rest breaks    Other ROM/Strengthening Exercises  PVC pipe slide, 12X flexion      Shoulder Exercises: Stretch   Wall Stretch - ABduction  2 reps;10 seconds      Manual Therapy   Manual Therapy  Myofascial release    Manual therapy comments  completed separately from therapeutic exercises    Myofascial Release  Myofascial release and manual techniques to right upper arm, trapezius, and scapular regions to decrease pain and fascial restrictions and increase joint ROM                OT Short Term Goals - 11/30/19 1718      OT SHORT TERM GOAL #1   Title  Pt will be provided with and educated on HEP to improve mobility required for RUE use during ADLs.    Time  4    Period  Weeks    Status  On-going    Target Date  12/27/19      OT SHORT TERM GOAL #2   Title  Pt will increase RUE P/ROM to [facility] to improve ability to perform dressing tasks with minimal compensatory techniques.    Time  4    Period  Weeks    Status  On-going      OT SHORT TERM GOAL #3   Title  Pt will increase RUE strength to 3+/5 to improve ability to use RUE for tasks completed at waist to chest height.    Time  4    Period  Weeks    Status  On-going        OT Long Term Goals - 11/30/19 1718      OT LONG TERM GOAL #1   Title  Pt will return to highest level of functioning during ADL and leisure tasks using RUE as non-dominant.    Time  8    Period  Weeks    Status  On-going      OT LONG TERM GOAL #2   Title  Pt will decrease pain in RUE to 3/10 or less to improve ability to sleep for 4 consecutive hours or greater.    Time  8    Period  Weeks    Status  On-going      OT LONG TERM GOAL #3   Title  Pt will increase RUE A/ROM to [facility] to improve ability to perform reaching tasks overhead and behind back.    Time  8    Period  Weeks    Status  On-going      OT LONG TERM GOAL #4   Title  Pt will decrease fascial restrictions in RUE to minimal amounts or less to improve mobility required for functional reaching tasks.  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    Time  8    Period  Weeks    Status  On-going            Plan - 01/01/20 1002    Clinical Impression Statement  A: Pt reports increased soreness and tightness since driving a lot this weekend. Continued with manual therapy  and passive stretching, AA/ROM in supine and standing with pt achieving ROM at ~65% with exception of abduction which is <50%. Added proximal shoulder strengthening in supine and scapular theraband row and extension today. Pt continues to be most limited with abduction. Verbal cuing for form and technique.    Body Structure / Function / Physical Skills  ADL;Endurance;Muscle spasms;UE functional use;Fascial restriction;Pain;ROM;IADL;Strength    Plan  P: Continue with AA/ROM and abduction stretching, scapular theraband    OT Home Exercise Plan  11/27/19: table slides; 12/06/19: scapular A/ROM extension and row; 3/18: standing isometrics; 3/25: AA/ROM exercises; 3/30: instructed to begin AA/ROM in standing    Consulted and Agree with Plan of Care  Patient       Patient will benefit from skilled therapeutic intervention in order to improve the following deficits and impairments:   Body Structure / Function / Physical Skills: ADL, Endurance, Muscle spasms, UE functional use, Fascial restriction, Pain, ROM, IADL, Strength       Visit Diagnosis: Acute pain of right shoulder  Stiffness of right shoulder, not elsewhere classified  Other symptoms and signs involving the musculoskeletal system    Problem List Patient Active Problem List   Diagnosis Date Noted  . S/P rotator cuff repair right 11/15/19 11/20/2019  . Nontraumatic incomplete tear of right rotator cuff   . Elevated hemoglobin A1c 12/26/2018  . Centrilobular emphysema (HCC) 12/12/2018  . Moderate recurrent major depression (HCC) 12/12/2018  . Elevated BP without diagnosis of hypertension 12/12/2018  . Hyperlipidemia 02/18/2017  . Psoriasis 11/11/2016  . Mild intermittent asthma 10/31/2015  . History of lymphadenitis 10/31/2015  . Tobacco abuse 10/31/2015  . Vitamin D deficiency 10/31/2015  . Allergic rhinitis due to pollen 10/31/2015   Ezra Sites, OTR/L  [phone] 01/01/2020, 10:23 AM  [facility] [address] [place], [place], [address] Phone: [phone]   Fax:  [phone]  Name: Jacqueline [name] MRN: [id number] Date of Birth: [birth year]

## 2020-01-03 ENCOUNTER — Ambulatory Visit (HOSPITAL_COMMUNITY): Payer: 59 | Admitting: Occupational Therapy

## 2020-01-03 ENCOUNTER — Other Ambulatory Visit: Payer: Self-pay

## 2020-01-03 ENCOUNTER — Encounter (HOSPITAL_COMMUNITY): Payer: Self-pay | Admitting: Occupational Therapy

## 2020-01-03 DIAGNOSIS — M25511 Pain in right shoulder: Secondary | ICD-10-CM | POA: Diagnosis not present

## 2020-01-03 DIAGNOSIS — M25611 Stiffness of right shoulder, not elsewhere classified: Secondary | ICD-10-CM

## 2020-01-03 DIAGNOSIS — R29898 Other symptoms and signs involving the musculoskeletal system: Secondary | ICD-10-CM

## 2020-01-03 NOTE — Therapy (Signed)
[facility] [address] [place], [place], [address] Phone: [phone]   Fax:  [phone]  Occupational Therapy Treatment  Patient Details  Name: Jacqueline [name] MRN: [id number] Date of Birth: 1961/03/15 Referring Provider (OT): Dr. Fuller Canada   Encounter Date: 01/03/2020  OT End of Session - 01/03/20 0935    Visit Number  11    Number of Visits  16    Date for OT Re-Evaluation  01/26/20    Authorization Type  [employer]    Authorization Time Period  $25 copay, 60 visit limit    Authorization - Visit Number  11    Authorization - Number of Visits  60    OT Start Time  0855    OT Stop Time  0935    OT Time Calculation (min)  40 min    Activity Tolerance  Patient tolerated treatment well    Behavior During Therapy  [facility] for tasks assessed/performed       Past Medical History:  Diagnosis Date  . Arthritis   . Asthma   . Cataract   . Complication of anesthesia    Fever after all surgeries  . Depression   . Joint pain   . Nausea   . Smoker     Past Surgical History:  Procedure Laterality Date  . ABDOMINAL HYSTERECTOMY  2000  . APPENDECTOMY  1981  . CATARACT EXTRACTION W/ INTRAOCULAR LENS IMPLANT Left   . SHOULDER ARTHROSCOPY WITH ROTATOR CUFF REPAIR Right 11/15/2019   Procedure: SHOULDER ARTHROSCOPY WITH ROTATOR CUFF REPAIR;  Surgeon: Vickki Hearing, MD;  Location: [facility];  Service: Orthopedics;  Laterality: Right;  . TONSILLECTOMY     59 years old    There were no vitals filed for this visit.  Subjective Assessment - 01/03/20 0854    Subjective   S: I've been doing a lot of chores this morning.    Currently in Pain?  No/denies         [facility] OT Assessment - 01/03/20 0854      Assessment   Medical Diagnosis  s/p right mini-open RCR with arthroscopy      Precautions   Precautions  Shoulder    Type of Shoulder Precautions  See protocol: 2/18-3/18: P/ROM;  3/19: AA/ROM & progress as tolerated                 OT Treatments/Exercises (OP) - 01/03/20 0858      Exercises   Exercises  Shoulder      Shoulder Exercises: Supine   Protraction  PROM;5 reps;AAROM;15 reps    Horizontal ABduction  PROM;5 reps;AAROM;15 reps    External Rotation  PROM;5 reps;AAROM;15 reps    Internal Rotation  PROM;5 reps;AAROM;15 reps    Flexion  PROM;5 reps;AAROM;15 reps    ABduction  PROM;5 reps;AAROM;15 reps      Shoulder Exercises: Standing   Horizontal ABduction  AAROM;10 reps    Flexion  AAROM;10 reps    ABduction  AAROM;10 reps    Extension  Theraband;10 reps    Theraband Level (Shoulder Extension)  Level 2 (Red)    Row  Theraband;10 reps    Theraband Level (Shoulder Row)  Level 2 (Red)      Shoulder Exercises: ROM/Strengthening   UBE (Upper Arm Bike)  Level 1 1' forward    Wall Wash  1'     Proximal Shoulder Strengthening, Supine  10X each, no rest breaks      Shoulder  Exercises: Stretch   External Rotation Stretch  3 reps;10 seconds    Wall Stretch - Flexion  3 reps;10 seconds    Wall Stretch - ABduction  3 reps;10 seconds      Manual Therapy   Manual Therapy  Myofascial release    Manual therapy comments  completed separately from therapeutic exercises    Myofascial Release  Myofascial release and manual techniques to right upper arm, trapezius, and scapular regions to decrease pain and fascial restrictions and increase joint ROM               OT Short Term Goals - 11/30/19 1718      OT SHORT TERM GOAL #1   Title  Pt will be provided with and educated on HEP to improve mobility required for RUE use during ADLs.    Time  4    Period  Weeks    Status  On-going    Target Date  12/27/19      OT SHORT TERM GOAL #2   Title  Pt will increase RUE P/ROM to [facility] to improve ability to perform dressing tasks with minimal compensatory techniques.    Time  4    Period  Weeks    Status  On-going      OT SHORT TERM GOAL #3   Title  Pt will increase RUE strength to 3+/5 to  improve ability to use RUE for tasks completed at waist to chest height.    Time  4    Period  Weeks    Status  On-going        OT Long Term Goals - 11/30/19 1718      OT LONG TERM GOAL #1   Title  Pt will return to highest level of functioning during ADL and leisure tasks using RUE as non-dominant.    Time  8    Period  Weeks    Status  On-going      OT LONG TERM GOAL #2   Title  Pt will decrease pain in RUE to 3/10 or less to improve ability to sleep for 4 consecutive hours or greater.    Time  8    Period  Weeks    Status  On-going      OT LONG TERM GOAL #3   Title  Pt will increase RUE A/ROM to [facility] to improve ability to perform reaching tasks overhead and behind back.    Time  8    Period  Weeks    Status  On-going      OT LONG TERM GOAL #4   Title  Pt will decrease fascial restrictions in RUE to minimal amounts or less to improve mobility required for functional reaching tasks.    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 Educated pt on shoulder precautions and on need for working with less weight and taking breaks frequently. Continued with manual therapy and  P/ROM today, increased supine AA/ROM repetitions to 15. Added wall flexion stretch and er stretch today to work on improving ROM and tolerance to stretching. Added UBE to somewhat simulate motion required for use of zero turn mower. Verbal cuing for form and technique.    Body Structure / Function / Physical Skills  ADL;Endurance;Muscle spasms;UE functional use;Fascial restriction;Pain;ROM;IADL;Strength    Plan  P: completed standing AA/ROM versus  supine to allow more time for additional exercises, attempt retraction with theraband    OT Home Exercise Plan  11/27/19: table slides; 12/06/19: scapular A/ROM extension and row; 3/18: standing isometrics; 3/25: AA/ROM exercises; 3/30: instructed to begin AA/ROM in standing    Consulted and Agree with Plan of Care  Patient       Patient will benefit from skilled therapeutic intervention in order to improve the following deficits and impairments:   Body Structure / Function / Physical Skills: ADL, Endurance, Muscle spasms, UE functional use, Fascial restriction, Pain, ROM, IADL, Strength       Visit Diagnosis: Acute pain of right shoulder  Stiffness of right shoulder, not elsewhere classified  Other symptoms and signs involving the musculoskeletal system    Problem List Patient Active Problem List   Diagnosis Date Noted  . S/P rotator cuff repair right 11/15/19 11/20/2019  . Nontraumatic incomplete tear of right rotator cuff   . Elevated hemoglobin A1c 12/26/2018  . Centrilobular emphysema (HCC) 12/12/2018  . Moderate recurrent major depression (HCC) 12/12/2018  . Elevated BP without diagnosis of hypertension 12/12/2018  . Hyperlipidemia 02/18/2017  . Psoriasis 11/11/2016  . Mild intermittent asthma 10/31/2015  . History of lymphadenitis 10/31/2015  . Tobacco abuse 10/31/2015  . Vitamin D deficiency 10/31/2015  . Allergic rhinitis due to pollen 10/31/2015   Ezra Sites, OTR/L  [phone] 01/03/2020, 9:37 AM  [name] [address] [place], [place], [address] Phone: [phone]   Fax:  [phone]  Name: Jacqueline [name] MRN: [id number] Date of Birth: 01/22/1961

## 2020-01-07 ENCOUNTER — Telehealth (HOSPITAL_COMMUNITY): Payer: Self-pay | Admitting: Occupational Therapy

## 2020-01-07 NOTE — Telephone Encounter (Signed)
pt cancelled appt for tomorrow, no reason given 

## 2020-01-08 ENCOUNTER — Ambulatory Visit (HOSPITAL_COMMUNITY): Payer: 59 | Admitting: Occupational Therapy

## 2020-01-10 ENCOUNTER — Encounter (HOSPITAL_COMMUNITY): Payer: Self-pay | Admitting: Occupational Therapy

## 2020-01-10 ENCOUNTER — Ambulatory Visit (HOSPITAL_COMMUNITY): Payer: 59 | Admitting: Occupational Therapy

## 2020-01-10 ENCOUNTER — Other Ambulatory Visit: Payer: Self-pay

## 2020-01-10 DIAGNOSIS — M25511 Pain in right shoulder: Secondary | ICD-10-CM | POA: Diagnosis not present

## 2020-01-10 DIAGNOSIS — R29898 Other symptoms and signs involving the musculoskeletal system: Secondary | ICD-10-CM

## 2020-01-10 DIAGNOSIS — M25611 Stiffness of right shoulder, not elsewhere classified: Secondary | ICD-10-CM

## 2020-01-10 NOTE — Patient Instructions (Signed)
  1) Flexion Wall Stretch    Face wall, place affected handon wall in front of you. Slide hand up the wall  and lean body in towards the wall. Hold for 10 seconds. Repeat 3-5 times. 1-2 times/day.     2) Towel Stretch with Internal Rotation   Or     Gently pull up (or to the side) your affected arm  behind your back with the assist of a towel. Hold 10 seconds, repeat 3-5 times. 1-2 times/day.             3) Corner Stretch    Stand at a corner of a wall, place your arms on the walls with elbows bent. Lean into the corner until a stretch is felt along the front of your chest and/or shoulders. Hold for 10 seconds. Repeat 3-5X, 1-2 times/day.    4) Posterior Capsule Stretch    Bring the involved arm across chest. Grasp elbow and pull toward chest until you feel a stretch in the back of the upper arm and shoulder. Hold 10 seconds. Repeat 3-5X. Complete 1-2 times/day.    5) Scapular Retraction    Tuck chin back as you pinch shoulder blades together.  Hold 5 seconds. Repeat 3-5X. Complete 1-2 times/day.    6) External Rotation Stretch:     Place your affected hand on the wall with the elbow bent and gently turn your body the opposite direction until a stretch is felt. Hold 10 seconds, repeat 3-5X. Complete 1-2 times/day.   7) Abduction Stretch     While standing next to wall, place your affected arm as pictured, then lean towards wall to stretch shoulder. Hold 10 seconds, repeat 3-5X. Complete 1-2 times/day.

## 2020-01-10 NOTE — Therapy (Signed)
[place], [place], [address] Phone: [phone]   Fax:  [phone]  Occupational Therapy Treatment  Patient Details  Name: Jacqueline [name] MRN: [id number] Date of Birth: 15-Aug-1961 Referring Provider (OT): Dr. Arther Abbott   Encounter Date: 01/10/2020  OT End of Session - 01/10/20 0942    Visit Number  12    Number of Visits  16    Date for OT Re-Evaluation  01/26/20    Authorization Type  UHC    Authorization Time Period  $25 copay, 60 visit limit    Authorization - Visit Number  12    Authorization - Number of Visits  12    OT Start Time  0859    OT Stop Time  0940    OT Time Calculation (min)  41 min    Activity Tolerance  Patient tolerated treatment well    Behavior During Therapy  [facility] for tasks assessed/performed       Past Medical History:  Diagnosis Date  . Arthritis   . Asthma   . Cataract   . Complication of anesthesia    Fever after all surgeries  . Depression   . Joint pain   . Nausea   . Smoker     Past Surgical History:  Procedure Laterality Date  . ABDOMINAL HYSTERECTOMY  2000  . APPENDECTOMY  1981  . CATARACT EXTRACTION W/ INTRAOCULAR LENS IMPLANT Left   . SHOULDER ARTHROSCOPY WITH ROTATOR CUFF REPAIR Right 11/15/2019   Procedure: SHOULDER ARTHROSCOPY WITH ROTATOR CUFF REPAIR;  Surgeon: Carole Civil, MD;  Location: [facility];  Service: Orthopedics;  Laterality: Right;  . TONSILLECTOMY     59 years old    There were no vitals filed for this visit.  Subjective Assessment - 01/10/20 0856    Subjective   S: I tried the zero turn and that night it was throbbing.    Currently in Pain?  Yes    Pain Score  6     Pain Location  Shoulder    Pain Orientation  Right    Pain Descriptors / Indicators  Aching;Sore    Pain Type  Acute pain    Pain Radiating Towards  N/A    Pain Onset  In the past 7 days    Pain Frequency  Intermittent    Aggravating Factors   movement,  exercise    Pain Relieving Factors  ice, pain medication    Effect of Pain on Daily Activities  min/mod effect on ADLs    Multiple Pain Sites  No         [facility] OT Assessment - 01/10/20 0856      Assessment   Medical Diagnosis  s/p right mini-open RCR with arthroscopy      Precautions   Precautions  Shoulder    Type of Shoulder Precautions  See protocol: 2/18-3/18: P/ROM;  3/19: AA/ROM & progress as tolerated                OT Treatments/Exercises (OP) - 01/10/20 0904      Exercises   Exercises  Shoulder      Shoulder Exercises: Supine   Protraction  PROM;5 reps    Horizontal ABduction  PROM;5 reps    External Rotation  PROM;5 reps    Internal Rotation  PROM;5 reps    Flexion  PROM;5 reps    ABduction  PROM;5 reps      Shoulder Exercises: Standing  Extension  Theraband;12 reps    Theraband Level (Shoulder Extension)  Level 2 (Red)    Row  Theraband;12 reps    Theraband Level (Shoulder Row)  Level 2 (Red)    Retraction  Theraband;10 reps    Theraband Level (Shoulder Retraction)  Level 2 (Red)      Shoulder Exercises: ROM/Strengthening   UBE (Upper Arm Bike)  Level 1 2' reverse      Shoulder Exercises: Stretch   Cross Chest Stretch  2 reps;20 seconds    Internal Rotation Stretch  2 reps   20" horizontal towel   External Rotation Stretch  2 reps;20 seconds    Wall Stretch - Flexion  2 reps;20 seconds    Wall Stretch - ABduction  2 reps;20 seconds    Other Shoulder Stretches  doorway stretch, 2x20"      Manual Therapy   Manual Therapy  Myofascial release    Manual therapy comments  completed separately from therapeutic exercises    Myofascial Release  Myofascial release and manual techniques to right upper arm, trapezius, and scapular regions to decrease pain and fascial restrictions and increase joint ROM             OT Education - 01/10/20 0924    Education Details  shoulder stretches    Person(s) Educated  Patient    Methods   Explanation;Demonstration;Handout    Comprehension  Verbalized understanding;Returned demonstration       OT Short Term Goals - 11/30/19 1718      OT SHORT TERM GOAL #1   Title  Pt will be provided with and educated on HEP to improve mobility required for RUE use during ADLs.    Time  4    Period  Weeks    Status  On-going    Target Date  12/27/19      OT SHORT TERM GOAL #2   Title  Pt will increase RUE P/ROM to [facility] to improve ability to perform dressing tasks with minimal compensatory techniques.    Time  4    Period  Weeks    Status  On-going      OT SHORT TERM GOAL #3   Title  Pt will increase RUE strength to 3+/5 to improve ability to use RUE for tasks completed at waist to chest height.    Time  4    Period  Weeks    Status  On-going        OT Long Term Goals - 11/30/19 1718      OT LONG TERM GOAL #1   Title  Pt will return to highest level of functioning during ADL and leisure tasks using RUE as non-dominant.    Time  8    Period  Weeks    Status  On-going      OT LONG TERM GOAL #2   Title  Pt will decrease pain in RUE to 3/10 or less to improve ability to sleep for 4 consecutive hours or greater.    Time  8    Period  Weeks    Status  On-going      OT LONG TERM GOAL #3   Title  Pt will increase RUE A/ROM to [facility] to improve ability to perform reaching tasks overhead and behind back.    Time  8    Period  Weeks    Status  On-going      OT LONG TERM GOAL #4   Title  Pt will decrease  fascial restrictions in RUE to minimal amounts or less to improve mobility required for functional reaching tasks.    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 Completed manual therapy for fascial restrictions, focused on shoulder stretches during session and updated for HEP. Added retraction with theraband, pt with mod difficulty with form. Also added UBE in reverse to work on scapular retraction. Verbal cuing for form and technique.    Body Structure / Function / Physical Skills  ADL;Endurance;Muscle spasms;UE functional use;Fascial restriction;Pain;ROM;IADL;Strength    Plan  P: Complete A/ROM in supine and AA/ROM in standing, continue to work on retraction with theraband, follow up on HEP    OT Home Exercise Plan  11/27/19: table slides; 12/06/19: scapular A/ROM extension and row; 3/18: standing isometrics; 3/25: AA/ROM exercises; 3/30: instructed to begin AA/ROM in standing; 4/15: shoulder stretches    Consulted and Agree with Plan of Care  Patient       Patient will benefit from skilled therapeutic intervention in order to improve the following deficits and impairments:   Body Structure / Function / Physical Skills: ADL, Endurance, Muscle spasms, UE functional use, Fascial restriction, Pain, ROM, IADL, Strength       Visit Diagnosis: Acute pain of right shoulder  Stiffness of right shoulder, not elsewhere classified  Other symptoms and signs involving the musculoskeletal system    Problem List Patient Active Problem List   Diagnosis Date Noted  . S/P rotator cuff repair right 11/15/19 11/20/2019  . Nontraumatic incomplete tear of right rotator cuff   . Elevated hemoglobin A1c 12/26/2018  . Centrilobular emphysema (HCC) 12/12/2018  . Moderate recurrent major depression (HCC) 12/12/2018  . Elevated BP without diagnosis of hypertension 12/12/2018  . Hyperlipidemia 02/18/2017  . Psoriasis 11/11/2016  . Mild intermittent asthma 10/31/2015  . History of lymphadenitis 10/31/2015  . Tobacco abuse 10/31/2015  . Vitamin D deficiency 10/31/2015  . Allergic rhinitis due  to pollen 10/31/2015    Ezra Sites, OTR/L  [phone] 01/10/2020, 9:45 AM  [place] [facility] [address] [place], [place], [address] Phone: [phone]   Fax:  [phone]  Name: KAYLINN DEDIC MRN: [id number] Date of Birth: [birth year]

## 2020-01-15 ENCOUNTER — Other Ambulatory Visit: Payer: Self-pay

## 2020-01-15 ENCOUNTER — Ambulatory Visit (HOSPITAL_COMMUNITY): Payer: 59 | Admitting: Occupational Therapy

## 2020-01-15 ENCOUNTER — Encounter (HOSPITAL_COMMUNITY): Payer: Self-pay | Admitting: Occupational Therapy

## 2020-01-15 DIAGNOSIS — M25511 Pain in right shoulder: Secondary | ICD-10-CM

## 2020-01-15 DIAGNOSIS — M25611 Stiffness of right shoulder, not elsewhere classified: Secondary | ICD-10-CM

## 2020-01-15 DIAGNOSIS — R29898 Other symptoms and signs involving the musculoskeletal system: Secondary | ICD-10-CM

## 2020-01-15 NOTE — Therapy (Signed)
[place] [facility] [address] [place], [place], [address] Phone: [phone]   Fax:  [phone]  Occupational Therapy Treatment  Patient Details  Name: Jacqueline [name] MRN: [id number] Date of Birth: 12/16/1960 Referring Provider (OT): Dr. Fuller Canada   Encounter Date: 01/15/2020  OT End of Session - 01/15/20 1028    Visit Number  13    Number of Visits  16    Date for OT Re-Evaluation  01/26/20    Authorization Type  UHC    Authorization Time Period  $25 copay, 60 visit limit    Authorization - Visit Number  13    Authorization - Number of Visits  60    OT Start Time  [phone]    OT Stop Time  1026    OT Time Calculation (min)  44 min    Activity Tolerance  Patient tolerated treatment well    Behavior During Therapy  [facility] for tasks assessed/performed       Past Medical History:  Diagnosis Date  . Arthritis   . Asthma   . Cataract   . Complication of anesthesia    Fever after all surgeries  . Depression   . Joint pain   . Nausea   . Smoker     Past Surgical History:  Procedure Laterality Date  . ABDOMINAL HYSTERECTOMY  2000  . APPENDECTOMY  1981  . CATARACT EXTRACTION W/ INTRAOCULAR LENS IMPLANT Left   . SHOULDER ARTHROSCOPY WITH ROTATOR CUFF REPAIR Right 11/15/2019   Procedure: SHOULDER ARTHROSCOPY WITH ROTATOR CUFF REPAIR;  Surgeon: Vickki Hearing, MD;  Location: [facility];  Service: Orthopedics;  Laterality: Right;  . TONSILLECTOMY     59 years old    There were no vitals filed for this visit.  Subjective Assessment - 01/15/20 0941    Subjective   S: The hardest exercise is the on on the wall.    Currently in Pain?  Yes    Pain Score  4     Pain Location  Shoulder    Pain Orientation  Right    Pain Descriptors / Indicators  Aching;Sore    Pain Type  Acute pain    Pain Radiating Towards  N/A    Pain Onset  In the past 7 days    Pain Frequency  Intermittent    Aggravating Factors   movement, exercise    Pain  Relieving Factors  ice, pain medication    Effect of Pain on Daily Activities  min/mod effect on ADLs    Multiple Pain Sites  No         [facility] OT Assessment - 01/15/20 0941      Assessment   Medical Diagnosis  s/p right mini-open RCR with arthroscopy      Precautions   Precautions  Shoulder    Type of Shoulder Precautions  See protocol: 2/18-3/18: P/ROM;  3/19: AA/ROM & progress as tolerated                OT Treatments/Exercises (OP) - 01/15/20 0944      Exercises   Exercises  Shoulder      Shoulder Exercises: Supine   Protraction  PROM;5 reps;AROM;10 reps    Horizontal ABduction  PROM;5 reps;AROM;10 reps    External Rotation  PROM;5 reps;AROM;10 reps    Internal Rotation  PROM;5 reps;AROM;10 reps    Flexion  PROM;5 reps;AROM;10 reps    ABduction  PROM;5 reps;AROM;10 reps  Shoulder Exercises: Standing   Protraction  AAROM;10 reps    Horizontal ABduction  AAROM;10 reps    External Rotation  AAROM;10 reps    Internal Rotation  AAROM;10 reps    Flexion  AAROM;10 reps    ABduction  AAROM;10 reps    Extension  Theraband;12 reps    Theraband Level (Shoulder Extension)  Level 2 (Red)    Row  Theraband;12 reps    Theraband Level (Shoulder Row)  Level 2 (Red)    Retraction  Theraband;10 reps    Theraband Level (Shoulder Retraction)  Level 2 (Red)      Shoulder Exercises: ROM/Strengthening   UBE (Upper Arm Bike)  Level 1 3' reverse   pace: 4.0   Proximal Shoulder Strengthening, Supine  10X each, no rest breaks      Manual Therapy   Manual Therapy  Myofascial release    Manual therapy comments  completed separately from therapeutic exercises    Myofascial Release  Myofascial release and manual techniques to right upper arm, trapezius, and scapular regions to decrease pain and fascial restrictions and increase joint ROM               OT Short Term Goals - 11/30/19 1718      OT SHORT TERM GOAL #1   Title  Pt will be provided with and educated on HEP  to improve mobility required for RUE use during ADLs.    Time  4    Period  Weeks    Status  On-going    Target Date  12/27/19      OT SHORT TERM GOAL #2   Title  Pt will increase RUE P/ROM to [facility] to improve ability to perform dressing tasks with minimal compensatory techniques.    Time  4    Period  Weeks    Status  On-going      OT SHORT TERM GOAL #3   Title  Pt will increase RUE strength to 3+/5 to improve ability to use RUE for tasks completed at waist to chest height.    Time  4    Period  Weeks    Status  On-going        OT Long Term Goals - 11/30/19 1718      OT LONG TERM GOAL #1   Title  Pt will return to highest level of functioning during ADL and leisure tasks using RUE as non-dominant.    Time  8    Period  Weeks    Status  On-going      OT LONG TERM GOAL #2   Title  Pt will decrease pain in RUE to 3/10 or less to improve ability to sleep for 4 consecutive hours or greater.    Time  8    Period  Weeks    Status  On-going      OT LONG TERM GOAL #3   Title  Pt will increase RUE A/ROM to [facility] to improve ability to perform reaching tasks overhead and behind back.    Time  8    Period  Weeks    Status  On-going      OT LONG TERM GOAL #4   Title  Pt will decrease fascial restrictions in RUE to minimal amounts or less to improve mobility required for functional reaching tasks.    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    Time  8    Period  Weeks    Status  On-going            Plan - 01/15/20 0959    Clinical Impression Statement  A: Pt reports her arm feels ok during the day but continues to have more pain at night. Continued with manual therapy today to address fascial restrictions. Pt continues to have limited tolerance to P/ROM greater than 50%. Progressed to A/ROM in supine,  pt with 50% ROM. Continued with scapular theraband, pt with mod to max difficulty with form during retraction. UBE completed in reverse for scapular mobility. Verbal cuing for form and technique.    Body Structure / Function / Physical Skills  ADL;Endurance;Muscle spasms;UE functional use;Fascial restriction;Pain;ROM;IADL;Strength    Plan  P: Continue with A/ROM supine and AA/ROM standing, form during theraband, wall wash    OT Home Exercise Plan  11/27/19: table slides; 12/06/19: scapular A/ROM extension and row; 3/18: standing isometrics; 3/25: AA/ROM exercises; 3/30: instructed to begin AA/ROM in standing; 4/15: shoulder stretches    Consulted and Agree with Plan of Care  Patient       Patient will benefit from skilled therapeutic intervention in order to improve the following deficits and impairments:   Body Structure / Function / Physical Skills: ADL, Endurance, Muscle spasms, UE functional use, Fascial restriction, Pain, ROM, IADL, Strength       Visit Diagnosis: Acute pain of right shoulder  Stiffness of right shoulder, not elsewhere classified  Other symptoms and signs involving the musculoskeletal system    Problem List Patient Active Problem List   Diagnosis Date Noted  . S/P rotator cuff repair right 11/15/19 11/20/2019  . Nontraumatic incomplete tear of right rotator cuff   . Elevated hemoglobin A1c 12/26/2018  . Centrilobular emphysema (HCC) 12/12/2018  . Moderate recurrent major depression (HCC) 12/12/2018  . Elevated BP without diagnosis of hypertension 12/12/2018  . Hyperlipidemia 02/18/2017  . Psoriasis 11/11/2016  . Mild intermittent asthma 10/31/2015  . History of lymphadenitis 10/31/2015  . Tobacco abuse 10/31/2015  . Vitamin D deficiency 10/31/2015  . Allergic rhinitis due to pollen 10/31/2015   Ezra Sites, OTR/L  [phone] 01/15/2020, 10:29 AM  [place] [facility] [address] [place], [place], [address] Phone:  [phone]   Fax:  [phone]  Name: Jacqueline [name] MRN: [id number] Date of Birth: [birth year]

## 2020-01-17 ENCOUNTER — Other Ambulatory Visit: Payer: Self-pay

## 2020-01-17 ENCOUNTER — Ambulatory Visit (HOSPITAL_COMMUNITY): Payer: 59 | Admitting: Occupational Therapy

## 2020-01-17 ENCOUNTER — Encounter (HOSPITAL_COMMUNITY): Payer: Self-pay | Admitting: Occupational Therapy

## 2020-01-17 DIAGNOSIS — R29898 Other symptoms and signs involving the musculoskeletal system: Secondary | ICD-10-CM

## 2020-01-17 DIAGNOSIS — M25611 Stiffness of right shoulder, not elsewhere classified: Secondary | ICD-10-CM

## 2020-01-17 DIAGNOSIS — M25511 Pain in right shoulder: Secondary | ICD-10-CM | POA: Diagnosis not present

## 2020-01-17 NOTE — Therapy (Signed)
[facility] [address] [place], [place], [address] Phone: [phone]   Fax:  [phone]  Occupational Therapy Treatment  Patient Details  Name: Jacqueline [name] MRN: [id number] Date of Birth: August 28, 1961 Referring Provider (OT): Dr. Fuller Canada   Encounter Date: 01/17/2020  OT End of Session - 01/17/20 0937    Visit Number  14    Number of Visits  16    Date for OT Re-Evaluation  01/26/20    Authorization Type  UHC    Authorization Time Period  $25 copay, 60 visit limit    Authorization - Visit Number  14    Authorization - Number of Visits  60    OT Start Time  0850    OT Stop Time  0932    OT Time Calculation (min)  42 min    Activity Tolerance  Patient tolerated treatment well    Behavior During Therapy  [facility] for tasks assessed/performed       Past Medical History:  Diagnosis Date  . Arthritis   . Asthma   . Cataract   . Complication of anesthesia    Fever after all surgeries  . Depression   . Joint pain   . Nausea   . Smoker     Past Surgical History:  Procedure Laterality Date  . ABDOMINAL HYSTERECTOMY  2000  . APPENDECTOMY  1981  . CATARACT EXTRACTION W/ INTRAOCULAR LENS IMPLANT Left   . SHOULDER ARTHROSCOPY WITH ROTATOR CUFF REPAIR Right 11/15/2019   Procedure: SHOULDER ARTHROSCOPY WITH ROTATOR CUFF REPAIR;  Surgeon: Vickki Hearing, MD;  Location: [facility];  Service: Orthopedics;  Laterality: Right;  . TONSILLECTOMY     59 years old    There were no vitals filed for this visit.  Subjective Assessment - 01/17/20 0850    Subjective   S: I've been doing chores this morning.    Currently in Pain?  Yes    Pain Score  2     Pain Location  Shoulder    Pain Orientation  Right    Pain Descriptors / Indicators  Aching;Sore    Pain Type  Acute pain    Pain Radiating Towards  N/A    Pain Onset  In the past 7 days    Pain Frequency  Intermittent    Aggravating Factors   movement, exericse    Pain  Relieving Factors  ice, pain medication    Effect of Pain on Daily Activities  min/mod effect on ADLs    Multiple Pain Sites  No         [facility] OT Assessment - 01/17/20 0850      Assessment   Medical Diagnosis  s/p right mini-open RCR with arthroscopy      Precautions   Precautions  Shoulder    Type of Shoulder Precautions  See protocol: 2/18-3/18: P/ROM;  3/19: AA/ROM & progress as tolerated                OT Treatments/Exercises (OP) - 01/17/20 0853      Exercises   Exercises  Shoulder      Shoulder Exercises: Supine   Protraction  PROM;5 reps;AROM;10 reps    Horizontal ABduction  PROM;5 reps;AROM;10 reps    External Rotation  PROM;5 reps;AROM;10 reps    Internal Rotation  PROM;5 reps;AROM;10 reps    Flexion  PROM;5 reps;AROM;10 reps    ABduction  PROM;5 reps;AROM;10 reps      Shoulder Exercises: Standing  Protraction  AAROM;12 reps    Horizontal ABduction  AAROM;12 reps    External Rotation  AAROM;12 reps    Internal Rotation  AAROM;12 reps    Flexion  AAROM;12 reps    ABduction  AAROM;12 reps    Extension  Theraband;12 reps    Theraband Level (Shoulder Extension)  Level 2 (Red)    Row  Theraband;12 reps    Theraband Level (Shoulder Row)  Level 2 (Red)    Retraction  Theraband;10 reps    Theraband Level (Shoulder Retraction)  Level 2 (Red)      Shoulder Exercises: Pulleys   ABduction  1 minute      Shoulder Exercises: ROM/Strengthening   Proximal Shoulder Strengthening, Supine  10X each, no rest breaks    Proximal Shoulder Strengthening, Seated  10X each, no rest breaks      Shoulder Exercises: Stretch   Internal Rotation Stretch  2 reps   horizontal towel, 20" holds   External Rotation Stretch  2 reps;20 seconds    Wall Stretch - Flexion  2 reps;20 seconds    Wall Stretch - ABduction  2 reps;20 seconds      Manual Therapy   Manual Therapy  Myofascial release    Manual therapy comments  completed separately from therapeutic exercises     Myofascial Release  Myofascial release and manual techniques to right upper arm, trapezius, and scapular regions to decrease pain and fascial restrictions and increase joint ROM               OT Short Term Goals - 11/30/19 1718      OT SHORT TERM GOAL #1   Title  Pt will be provided with and educated on HEP to improve mobility required for RUE use during ADLs.    Time  4    Period  Weeks    Status  On-going    Target Date  12/27/19      OT SHORT TERM GOAL #2   Title  Pt will increase RUE P/ROM to [facility] to improve ability to perform dressing tasks with minimal compensatory techniques.    Time  4    Period  Weeks    Status  On-going      OT SHORT TERM GOAL #3   Title  Pt will increase RUE strength to 3+/5 to improve ability to use RUE for tasks completed at waist to chest height.    Time  4    Period  Weeks    Status  On-going        OT Long Term Goals - 11/30/19 1718      OT LONG TERM GOAL #1   Title  Pt will return to highest level of functioning during ADL and leisure tasks using RUE as non-dominant.    Time  8    Period  Weeks    Status  On-going      OT LONG TERM GOAL #2   Title  Pt will decrease pain in RUE to 3/10 or less to improve ability to sleep for 4 consecutive hours or greater.    Time  8    Period  Weeks    Status  On-going      OT LONG TERM GOAL #3   Title  Pt will increase RUE A/ROM to [facility] to improve ability to perform reaching tasks overhead and behind back.    Time  8    Period  Weeks    Status  On-going  OT LONG TERM GOAL #4   Title  Pt will decrease fascial restrictions in RUE to minimal amounts or less to improve mobility required for functional reaching tasks.    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    Time  8    Period  Weeks    Status  On-going             Plan - 01/17/20 5732    Clinical Impression Statement  A: Continued with manual techniques and passive stretching at beginning of session, pt with tightness and guarding limiting passive stretching past 50-60% ROM. Continued with A/ROM in supine and AA/ROM in standing, resumed sustained stretches at wall with ROM at approximately 60%. Added proximal shoulder strengthening in standing today. Verbal cuing for form and technique.    Body Structure / Function / Physical Skills  ADL;Endurance;Muscle spasms;UE functional use;Fascial restriction;Pain;ROM;IADL;Strength    Plan  P: Continue with ROM exercises, stretches, add functional reaching task    OT Home Exercise Plan  11/27/19: table slides; 12/06/19: scapular A/ROM extension and row; 3/18: standing isometrics; 3/25: AA/ROM exercises; 3/30: instructed to begin AA/ROM in standing; 4/15: shoulder stretches    Consulted and Agree with Plan of Care  Patient       Patient will benefit from skilled therapeutic intervention in order to improve the following deficits and impairments:   Body Structure / Function / Physical Skills: ADL, Endurance, Muscle spasms, UE functional use, Fascial restriction, Pain, ROM, IADL, Strength       Visit Diagnosis: Acute pain of right shoulder  Stiffness of right shoulder, not elsewhere classified  Other symptoms and signs involving the musculoskeletal system    Problem List Patient Active Problem List   Diagnosis Date Noted  . S/P rotator cuff repair right 11/15/19 11/20/2019  . Nontraumatic incomplete tear of right rotator cuff   . Elevated hemoglobin A1c 12/26/2018  . Centrilobular emphysema ([place]) 12/12/2018  . Moderate recurrent major depression ([place]) 12/12/2018  . Elevated BP without diagnosis of hypertension 12/12/2018  . Hyperlipidemia 02/18/2017  . Psoriasis 11/11/2016  . Mild intermittent asthma 10/31/2015  . History of lymphadenitis 10/31/2015  . Tobacco abuse 10/31/2015  . Vitamin D  deficiency 10/31/2015  . Allergic rhinitis due to pollen 10/31/2015   Guadelupe Sabin, OTR/L  [phone] 01/17/2020, 9:40 AM  [place], [place], [address] Phone: [phone]   Fax:  [phone]  Name: Jacqueline [name] MRN: [id number] Date of Birth: 1961-01-22

## 2020-01-22 ENCOUNTER — Encounter (HOSPITAL_COMMUNITY): Payer: Self-pay | Admitting: Occupational Therapy

## 2020-01-22 ENCOUNTER — Other Ambulatory Visit: Payer: Self-pay

## 2020-01-22 ENCOUNTER — Ambulatory Visit (HOSPITAL_COMMUNITY): Payer: 59 | Admitting: Occupational Therapy

## 2020-01-22 DIAGNOSIS — M25511 Pain in right shoulder: Secondary | ICD-10-CM | POA: Diagnosis not present

## 2020-01-22 DIAGNOSIS — R29898 Other symptoms and signs involving the musculoskeletal system: Secondary | ICD-10-CM

## 2020-01-22 DIAGNOSIS — M25611 Stiffness of right shoulder, not elsewhere classified: Secondary | ICD-10-CM

## 2020-01-22 NOTE — Therapy (Signed)
[place], [place], [address] Phone: [phone]   Fax:  [phone]  Occupational Therapy Treatment  Patient Details  Name: Jacqueline [name] MRN: [id number] Date of Birth: July 09, 1961 Referring Provider (OT): Dr. Arther Abbott   Encounter Date: 01/22/2020  OT End of Session - 01/22/20 1023    Visit Number  15    Number of Visits  16    Date for OT Re-Evaluation  01/26/20    Authorization Type  [employer]    Authorization Time Period  $25 copay, 60 visit limit    Authorization - Visit Number  15    Authorization - Number of Visits  42    OT Start Time  0945    OT Stop Time  1024    OT Time Calculation (min)  39 min    Activity Tolerance  Patient tolerated treatment well    Behavior During Therapy  [facility] for tasks assessed/performed       Past Medical History:  Diagnosis Date  . Arthritis   . Asthma   . Cataract   . Complication of anesthesia    Fever after all surgeries  . Depression   . Joint pain   . Nausea   . Smoker     Past Surgical History:  Procedure Laterality Date  . ABDOMINAL HYSTERECTOMY  2000  . APPENDECTOMY  1981  . CATARACT EXTRACTION W/ INTRAOCULAR LENS IMPLANT Left   . SHOULDER ARTHROSCOPY WITH ROTATOR CUFF REPAIR Right 11/15/2019   Procedure: SHOULDER ARTHROSCOPY WITH ROTATOR CUFF REPAIR;  Surgeon: Carole Civil, MD;  Location: [facility];  Service: Orthopedics;  Laterality: Right;  . TONSILLECTOMY     59 years old    There were no vitals filed for this visit.  Subjective Assessment - 01/22/20 0943    Subjective   S: I was crying at 4 o'clock this morning.    Currently in Pain?  Yes    Pain Score  2     Pain Location  Shoulder    Pain Orientation  Right    Pain Descriptors / Indicators  Aching;Sore    Pain Type  Acute pain    Pain Radiating Towards  N/A    Pain Onset  In the past 7 days    Pain Frequency  Intermittent    Aggravating Factors   movement, exercise    Pain  Relieving Factors  muscle relaxer, pain medication    Effect of Pain on Daily Activities  min/mod effect on ADLs    Multiple Pain Sites  No         [facility] OT Assessment - 01/22/20 0943      Assessment   Medical Diagnosis  s/p right mini-open RCR with arthroscopy      Precautions   Precautions  Shoulder    Type of Shoulder Precautions  See protocol: 2/18-3/18: P/ROM;  3/19: AA/ROM & progress as tolerated                OT Treatments/Exercises (OP) - 01/22/20 0950      Exercises   Exercises  Shoulder      Shoulder Exercises: Supine   Protraction  PROM;5 reps    Horizontal ABduction  PROM;5 reps    External Rotation  PROM;5 reps    Internal Rotation  PROM;5 reps    Flexion  PROM;5 reps    ABduction  PROM;5 reps      Shoulder Exercises: Standing   Protraction  AROM;10 reps    Horizontal ABduction  AROM;10 reps    External Rotation  AROM;10 reps    Internal Rotation  AROM;10 reps    Flexion  AROM;10 reps;Limitations    Flexion Limitations  ROM limited to ~50-60%      Shoulder Exercises: ROM/Strengthening   Other ROM/Strengthening Exercises  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 10X each      Shoulder Exercises: Stretch   Internal Rotation Stretch  2 reps   horizontal towel, 20" holds   External Rotation Stretch  2 reps;20 seconds    Wall Stretch - Flexion  2 reps;20 seconds    Wall Stretch - ABduction  2 reps;20 seconds      Functional Reaching Activities   Mid Level  Pt placed 10 cones onto middle shelf of overhead cabinet in flexion, removed in abduction. Mod difficulty      Manual Therapy   Manual Therapy  Myofascial release    Manual therapy comments  completed separately from therapeutic exercises    Myofascial Release  Myofascial release and manual techniques to right upper arm, trapezius, and scapular regions to decrease pain and fascial restrictions and increase joint ROM               OT Short Term Goals - 11/30/19 1718       OT SHORT TERM GOAL #1   Title  Pt will be provided with and educated on HEP to improve mobility required for RUE use during ADLs.    Time  4    Period  Weeks    Status  On-going    Target Date  12/27/19      OT SHORT TERM GOAL #2   Title  Pt will increase RUE P/ROM to [facility] to improve ability to perform dressing tasks with minimal compensatory techniques.    Time  4    Period  Weeks    Status  On-going      OT SHORT TERM GOAL #3   Title  Pt will increase RUE strength to 3+/5 to improve ability to use RUE for tasks completed at waist to chest height.    Time  4    Period  Weeks    Status  On-going        OT Long Term Goals - 11/30/19 1718      OT LONG TERM GOAL #1   Title  Pt will return to highest level of functioning during ADL and leisure tasks using RUE as non-dominant.    Time  8    Period  Weeks    Status  On-going      OT LONG TERM GOAL #2   Title  Pt will decrease pain in RUE to 3/10 or less to improve ability to sleep for 4 consecutive hours or greater.    Time  8    Period  Weeks    Status  On-going      OT LONG TERM GOAL #3   Title  Pt will increase RUE A/ROM to [facility] to improve ability to perform reaching tasks overhead and behind back.    Time  8    Period  Weeks    Status  On-going      OT LONG TERM GOAL #4   Title  Pt will decrease fascial restrictions in RUE to minimal amounts or less to improve mobility required for functional reaching tasks.    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    Time  8    Period  Weeks    Status  On-going            Plan - 01/22/20 1011    Clinical Impression Statement  A: Pt reports significant aching pain at night, however during the day her RUE feels ok when she is using it. Educated on use of heat and completing stretches when RUE begins  aching more at night. Continued with stretches today, pt with increased tolerance to ROM when she is completing stretches versus with P/ROM. Added functional reaching task today, increased time to complete. Also added ball pass for er/IR work. Progressed to A/ROM in standing, ROM in flexion continues to be limited. Verbal cuing for form and technique.    Body Structure / Function / Physical Skills  ADL;Endurance;Muscle spasms;UE functional use;Fascial restriction;Pain;ROM;IADL;Strength    Plan  P: Reassessment, Continue with functional reaching, have pt perform stretches versus completing P/ROM    OT Home Exercise Plan  11/27/19: table slides; 12/06/19: scapular A/ROM extension and row; 3/18: standing isometrics; 3/25: AA/ROM exercises; 3/30: instructed to begin AA/ROM in standing; 4/15: shoulder stretches    Consulted and Agree with Plan of Care  Patient       Patient will benefit from skilled therapeutic intervention in order to improve the following deficits and impairments:   Body Structure / Function / Physical Skills: ADL, Endurance, Muscle spasms, UE functional use, Fascial restriction, Pain, ROM, IADL, Strength       Visit Diagnosis: Acute pain of right shoulder  Stiffness of right shoulder, not elsewhere classified  Other symptoms and signs involving the musculoskeletal system    Problem List Patient Active Problem List   Diagnosis Date Noted  . S/P rotator cuff repair right 11/15/19 11/20/2019  . Nontraumatic incomplete tear of right rotator cuff   . Elevated hemoglobin A1c 12/26/2018  . Centrilobular emphysema (HCC) 12/12/2018  . Moderate recurrent major depression (HCC) 12/12/2018  . Elevated BP without diagnosis of hypertension 12/12/2018  . Hyperlipidemia 02/18/2017  . Psoriasis 11/11/2016  . Mild intermittent asthma 10/31/2015  . History of lymphadenitis 10/31/2015  . Tobacco abuse 10/31/2015  . Vitamin D deficiency 10/31/2015  . Allergic rhinitis due to pollen  10/31/2015   Ezra Sites, OTR/L  [phone] 01/22/2020, 10:24 AM  [facility] [address] [place], [place], [address] Phone: [phone]   Fax:  [phone]  Name: Jacqueline [name] MRN: [id number] Date of Birth: 05-12-1961

## 2020-01-24 ENCOUNTER — Encounter (HOSPITAL_COMMUNITY): Payer: Self-pay | Admitting: Occupational Therapy

## 2020-01-24 ENCOUNTER — Other Ambulatory Visit: Payer: Self-pay

## 2020-01-24 ENCOUNTER — Ambulatory Visit (HOSPITAL_COMMUNITY): Payer: 59 | Admitting: Occupational Therapy

## 2020-01-24 DIAGNOSIS — R29898 Other symptoms and signs involving the musculoskeletal system: Secondary | ICD-10-CM

## 2020-01-24 DIAGNOSIS — M25511 Pain in right shoulder: Secondary | ICD-10-CM | POA: Diagnosis not present

## 2020-01-24 DIAGNOSIS — M25611 Stiffness of right shoulder, not elsewhere classified: Secondary | ICD-10-CM

## 2020-01-24 NOTE — Therapy (Signed)
[place], [place], [address] Phone: [phone]   Fax:  [phone]  Occupational Therapy Reassessment, Treatment (recertification)  Patient Details  Name: Jacqueline [name] MRN: [id number] Date of Birth: 1960/12/03 Referring Provider (OT): Dr. Arther Abbott   Encounter Date: 01/24/2020  OT End of Session - 01/24/20 0941    Visit Number  16    Number of Visits  20    Date for OT Re-Evaluation  03/07/20    Authorization Type  [employer]    Authorization Time Period  $25 copay, 60 visit limit    Authorization - Visit Number  16    Authorization - Number of Visits  60    OT Start Time  0900    OT Stop Time  0930    OT Time Calculation (min)  30 min    Activity Tolerance  Patient tolerated treatment well    Behavior During Therapy  [facility] for tasks assessed/performed       Past Medical History:  Diagnosis Date  . Arthritis   . Asthma   . Cataract   . Complication of anesthesia    Fever after all surgeries  . Depression   . Joint pain   . Nausea   . Smoker     Past Surgical History:  Procedure Laterality Date  . ABDOMINAL HYSTERECTOMY  2000  . APPENDECTOMY  1981  . CATARACT EXTRACTION W/ INTRAOCULAR LENS IMPLANT Left   . SHOULDER ARTHROSCOPY WITH ROTATOR CUFF REPAIR Right 11/15/2019   Procedure: SHOULDER ARTHROSCOPY WITH ROTATOR CUFF REPAIR;  Surgeon: Carole Civil, MD;  Location: [facility];  Service: Orthopedics;  Laterality: Right;  . TONSILLECTOMY     59 years old    There were no vitals filed for this visit.  Subjective Assessment - 01/24/20 0855    Subjective   S: It's been spasming a lot and has been hurting a lot.    Currently in Pain?  Yes    Pain Score  6     Pain Location  Shoulder    Pain Orientation  Right    Pain Descriptors / Indicators  Aching;Sore;Spasm    Pain Type  Acute pain    Pain Radiating Towards  N/A    Pain Onset  In the past 7 days    Pain Frequency  Intermittent    Aggravating Factors   movement, exercise    Pain Relieving Factors  nothing    Effect of Pain on Daily Activities  mod effect on ADLs    Multiple Pain Sites  No         [facility] OT Assessment - 01/24/20 0855      Assessment   Medical Diagnosis  s/p right mini-open RCR with arthroscopy      Precautions   Precautions  Shoulder    Type of Shoulder Precautions  See protocol: 2/18-3/18: P/ROM;  3/19: AA/ROM & progress as tolerated       Observation/Other Assessments   Focus on Therapeutic Outcomes (FOTO)   55/100   49/100 previous     Palpation   Palpation comment  mod fascial restrictions in right upper arm, trapezius, and scapular regions      AROM   Overall AROM Comments  Assessed seated, er/IR adducted    AROM Assessment Site  Shoulder    Right/Left Shoulder  Right    Right Shoulder Flexion  105 Degrees   98 previous   Right Shoulder ABduction  108  Degrees   70 previous   Right Shoulder Internal Rotation  90 Degrees   same as previous   Right Shoulder External Rotation  64 Degrees   65 previous     PROM   Overall PROM Comments  Assessed supine, er/IR adducted    PROM Assessment Site  Shoulder    Right/Left Shoulder  Right    Right Shoulder Flexion  135 Degrees   142 previous   Right Shoulder ABduction  89 Degrees   100 previous   Right Shoulder Internal Rotation  90 Degrees   same as previous   Right Shoulder External Rotation  34 Degrees   60 previous     Strength   Overall Strength Comments  Assessed seated, er/IR adducted    Strength Assessment Site  Shoulder    Right/Left Shoulder  Right    Right Shoulder Flexion  4-/5   not previously assessed   Right Shoulder ABduction  3-/5   not previously assessed   Right Shoulder Internal Rotation  4+/5   not previously assessed   Right Shoulder External Rotation  4-/5   not previously assessed              OT Treatments/Exercises (OP) - 01/24/20 0857      Exercises   Exercises  Shoulder       Shoulder Exercises: Supine   Protraction  PROM;5 reps    External Rotation  PROM;5 reps    Internal Rotation  PROM;5 reps    Flexion  PROM;5 reps    ABduction  PROM;5 reps      Moist Heat Therapy   Number Minutes Moist Heat  10 Minutes    Moist Heat Location  Shoulder      Manual Therapy   Manual Therapy  Myofascial release    Manual therapy comments  completed separately from therapeutic exercises    Myofascial Release  Myofascial release and manual techniques to right upper arm, trapezius, and scapular regions to decrease pain and fascial restrictions and increase joint ROM             OT Education - 01/24/20 0940    Education Details  educated on resting arm as much as possible over the weekend, completing HEP 1x/day and only going to point of stretch/discomfort but not to pain    Person(s) Educated  Patient    Methods  Explanation    Comprehension  Verbalized understanding       OT Short Term Goals - 01/24/20 6203      OT SHORT TERM GOAL #1   Title  Pt will be provided with and educated on HEP to improve mobility required for RUE use during ADLs.    Time  4    Period  Weeks    Status  Achieved    Target Date  12/27/19      OT SHORT TERM GOAL #2   Title  Pt will increase RUE P/ROM to [facility] to improve ability to perform dressing tasks with minimal compensatory techniques.    Time  4    Period  Weeks    Status  On-going      OT SHORT TERM GOAL #3   Title  Pt will increase RUE strength to 3+/5 to improve ability to use RUE for tasks completed at waist to chest height.    Time  4    Period  Weeks    Status  Achieved        OT Long  Term Goals - 01/24/20 2122      OT LONG TERM GOAL #1   Title  Pt will return to highest level of functioning during ADL and leisure tasks using RUE as non-dominant.    Time  8    Period  Weeks    Status  On-going      OT LONG TERM GOAL #2   Title  Pt will decrease pain in RUE to 3/10 or less to improve ability to sleep for 4  consecutive hours or greater.    Time  8    Period  Weeks    Status  On-going      OT LONG TERM GOAL #3   Title  Pt will increase RUE A/ROM to [facility] to improve ability to perform reaching tasks overhead and behind back.    Time  8    Period  Weeks    Status  On-going      OT LONG TERM GOAL #4   Title  Pt will decrease fascial restrictions in RUE to minimal amounts or less to improve mobility required for functional reaching tasks.    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    Time  8    Period  Weeks    Status  On-going            Plan - 01/24/20 4825    Clinical Impression Statement  A: Pt reports increased pain over the past 2 days. Pt reports she has had to do a lot of driving, has been handling the horses, and one of the horses rubbed his head against her right arm. Pt with increased pain and muscle spasms today, with occasional shooting pains radiating from shoulder to hand. Session focusing on pain management and reassessment, pt has lost passive ROM since previous measurements however suspect due to increased pain and decreased tolerance to ROM today as last session her ROM was better. Pt has made slight gains in A/ROM, today OT notes increased trapezius compensatory activation with ROM due to pain. Pt has met 2 STGs thus far, reports she can reach the middle cabinet of her kitchen cabinets now. Discussed new pain onset and educated on importance of letting her arm rest and limiting heavy work as much as possible. Pt has high copay and would like to reduce visits to 1x/week going forward after MD visit.    OT Occupational Profile and History  Problem Focused Assessment - Including review of records relating to presenting problem    Occupational performance deficits (Please refer to evaluation for details):   ADL's;IADL's;Rest and Sleep;Work;Leisure    Body Structure / Function / Physical Skills  ADL;Endurance;Muscle spasms;UE functional use;Fascial restriction;Pain;ROM;IADL;Strength    Rehab Potential  Good    Clinical Decision Making  Limited treatment options, no task modification necessary    Comorbidities Affecting Occupational Performance:  None    Modification or Assistance to Complete Evaluation   No modification of tasks or assist necessary to complete eval    OT Frequency  1x / week    OT Duration  4 weeks    OT Treatment/Interventions  Self-care/ADL training;Ultrasound;Patient/family education;Passive range of motion;Cryotherapy;Electrical Stimulation;Moist Heat;Therapeutic exercise;Manual Therapy;Therapeutic activities    Plan  P: pt will benefit  from continued skilled OT services to improve functional use of RUE as dominant. Follow up on MD appt. Follow up on pain and resume ROM and functional reaching tasks as pt is able.    OT Home Exercise Plan  11/27/19: table slides; 12/06/19: scapular A/ROM extension and row; 3/18: standing isometrics; 3/25: AA/ROM exercises; 3/30: instructed to begin AA/ROM in standing; 4/15: shoulder stretches    Consulted and Agree with Plan of Care  Patient       Patient will benefit from skilled therapeutic intervention in order to improve the following deficits and impairments:   Body Structure / Function / Physical Skills: ADL, Endurance, Muscle spasms, UE functional use, Fascial restriction, Pain, ROM, IADL, Strength       Visit Diagnosis: Acute pain of right shoulder  Stiffness of right shoulder, not elsewhere classified  Other symptoms and signs involving the musculoskeletal system    Problem List Patient Active Problem List   Diagnosis Date Noted  . S/P rotator cuff repair right 11/15/19 11/20/2019  . Nontraumatic incomplete tear of right rotator cuff   . Elevated hemoglobin A1c 12/26/2018  . Centrilobular emphysema ([place]) 12/12/2018  .  Moderate recurrent major depression ([place]) 12/12/2018  . Elevated BP without diagnosis of hypertension 12/12/2018  . Hyperlipidemia 02/18/2017  . Psoriasis 11/11/2016  . Mild intermittent asthma 10/31/2015  . History of lymphadenitis 10/31/2015  . Tobacco abuse 10/31/2015  . Vitamin D deficiency 10/31/2015  . Allergic rhinitis due to pollen 10/31/2015   Guadelupe Sabin, OTR/L  [phone] 01/24/2020, 9:46 AM  [place] [address] [place], [place], [address] Phone: [phone]   Fax:  [phone]  Name: Jacqueline [name] MRN: [id number] Date of Birth: Feb 08, 1961

## 2020-01-30 ENCOUNTER — Encounter (HOSPITAL_COMMUNITY): Payer: Self-pay | Admitting: Occupational Therapy

## 2020-02-01 ENCOUNTER — Other Ambulatory Visit: Payer: Self-pay

## 2020-02-01 ENCOUNTER — Ambulatory Visit (INDEPENDENT_AMBULATORY_CARE_PROVIDER_SITE_OTHER): Payer: 59 | Admitting: Orthopedic Surgery

## 2020-02-01 ENCOUNTER — Telehealth: Payer: Self-pay | Admitting: Orthopedic Surgery

## 2020-02-01 ENCOUNTER — Other Ambulatory Visit: Payer: Self-pay | Admitting: Orthopedic Surgery

## 2020-02-01 ENCOUNTER — Encounter: Payer: Self-pay | Admitting: Orthopedic Surgery

## 2020-02-01 DIAGNOSIS — Z9889 Other specified postprocedural states: Secondary | ICD-10-CM

## 2020-02-01 MED ORDER — OXYCODONE HCL 5 MG PO CAPS: 5.0000 mg | ORAL_CAPSULE | Freq: Four times a day (QID) | ORAL | 0 refills | Status: DC | PRN

## 2020-02-01 MED ORDER — OXYCODONE HCL 5 MG PO CAPS: 5.0000 mg | ORAL_CAPSULE | Freq: Four times a day (QID) | ORAL | 0 refills | Status: AC | PRN

## 2020-02-01 MED ORDER — TIZANIDINE HCL 4 MG PO TABS: 4.0000 mg | ORAL_TABLET | Freq: Three times a day (TID) | ORAL | 1 refills | Status: AC

## 2020-02-01 NOTE — Progress Notes (Signed)
Chief Complaint  Patient presents with  . 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 oxycodone (OXY-IR) 5 MG capsule    Sig: Take 1 capsule (5 mg total) by mouth every 6 (six) hours as needed for up to 5 days.    Dispense:  20 capsule    Refill:  0   Fu 2 months   Continue therapy   I cautioned her again about her activity

## 2020-02-01 NOTE — Telephone Encounter (Signed)
Patient called and stated that her pharmacy states they are out of the pain medication that Dr. Romeo Apple sent in for her.  She needs the prescription sent to the [facility] in [place] please  She also asks for refill on muscle relaxer

## 2020-02-01 NOTE — Patient Instructions (Signed)
Continue therapy  BE CAREFUL  This operation takes a year for full recovery

## 2020-02-01 NOTE — Progress Notes (Signed)
Meds ordered this encounter  Medications  . oxycodone (OXY-IR) 5 MG capsule    Sig: Take 1 capsule (5 mg total) by mouth every 6 (six) hours as needed for up to 5 days.    Dispense:  20 capsule    Refill:  0  . tiZANidine (ZANAFLEX) 4 MG tablet    Sig: Take 1 tablet (4 mg total) by mouth 3 (three) times daily.    Dispense:  30 tablet    Refill:  1

## 2020-02-06 ENCOUNTER — Ambulatory Visit (HOSPITAL_COMMUNITY): Payer: 59 | Attending: Orthopedic Surgery | Admitting: Occupational Therapy

## 2020-02-06 ENCOUNTER — Other Ambulatory Visit: Payer: Self-pay

## 2020-02-06 ENCOUNTER — Encounter (HOSPITAL_COMMUNITY): Payer: Self-pay | Admitting: Occupational Therapy

## 2020-02-06 DIAGNOSIS — M25511 Pain in right shoulder: Secondary | ICD-10-CM

## 2020-02-06 DIAGNOSIS — R29898 Other symptoms and signs involving the musculoskeletal system: Secondary | ICD-10-CM

## 2020-02-06 DIAGNOSIS — M25611 Stiffness of right shoulder, not elsewhere classified: Secondary | ICD-10-CM | POA: Insufficient documentation

## 2020-02-06 NOTE — Therapy (Signed)
[facility] [address] [place], [place], [address] Phone: [phone]   Fax:  [phone]  Occupational Therapy Treatment  Patient Details  Name: Jacqueline [name] MRN: [id number] Date of Birth: September 22, 1961 Referring Provider (OT): Dr. Fuller Canada   Encounter Date: 02/06/2020  OT End of Session - 02/06/20 0941    Visit Number  17    Number of Visits  20    Date for OT Re-Evaluation  03/07/20    Authorization Type  [employer]    Authorization Time Period  $25 copay, 60 visit limit    Authorization - Visit Number  17    Authorization - Number of Visits  60    OT Start Time  0900    OT Stop Time  0941    OT Time Calculation (min)  41 min    Activity Tolerance  Patient tolerated treatment well    Behavior During Therapy  [facility] for tasks assessed/performed       Past Medical History:  Diagnosis Date  . Arthritis   . Asthma   . Cataract   . Complication of anesthesia    Fever after all surgeries  . Depression   . Joint pain   . Nausea   . Smoker     Past Surgical History:  Procedure Laterality Date  . ABDOMINAL HYSTERECTOMY  2000  . APPENDECTOMY  1981  . CATARACT EXTRACTION W/ INTRAOCULAR LENS IMPLANT Left   . SHOULDER ARTHROSCOPY WITH ROTATOR CUFF REPAIR Right 11/15/2019   Procedure: SHOULDER ARTHROSCOPY WITH ROTATOR CUFF REPAIR;  Surgeon: Vickki Hearing, MD;  Location: [facility];  Service: Orthopedics;  Laterality: Right;  . TONSILLECTOMY     59 years old    There were no vitals filed for this visit.  Subjective Assessment - 02/06/20 0857    Subjective   S: The doctor didn't really say much.    Currently in Pain?  Yes    Pain Score  5     Pain Location  Shoulder    Pain Orientation  Right    Pain Descriptors / Indicators  Sore    Pain Type  Acute pain    Pain Radiating Towards  N/A    Pain Onset  In the past 7 days    Pain Frequency  Constant    Aggravating Factors   farm work    Pain Relieving Factors  heat,  pain medication    Effect of Pain on Daily Activities  mod effect on ADLs    Multiple Pain Sites  No         [facility] OT Assessment - 02/06/20 0857      Assessment   Medical Diagnosis  s/p right mini-open RCR with arthroscopy      Precautions   Precautions  Shoulder    Type of Shoulder Precautions  Progress as tolerated               OT Treatments/Exercises (OP) - 02/06/20 0905      Exercises   Exercises  Shoulder      Shoulder Exercises: Supine   Protraction  AROM;10 reps    Horizontal ABduction  AROM;10 reps    External Rotation  AROM;10 reps    Internal Rotation  AROM;10 reps    Flexion  AROM;10 reps    ABduction  AROM;10 reps    Other Supine Exercises  serratus anterior punch, 10X A/ROM      Shoulder Exercises: ROM/Strengthening   Other  ROM/Strengthening Exercises  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 10X each      Shoulder Exercises: Stretch   Internal Rotation Stretch  2 reps   horizontal towel, 30" holds   External Rotation Stretch  2 reps;30 seconds    Wall Stretch - Flexion  2 reps;30 seconds    Wall Stretch - ABduction  2 reps;30 seconds      Functional Reaching Activities   High Level  Pt placed 10 squigz on door reaching as high as possible into flexion. Pt then removed in abduction.       Manual Therapy   Manual Therapy  Myofascial release    Manual therapy comments  completed separately from therapeutic exercises    Myofascial Release  Myofascial release and manual techniques to right upper arm, trapezius, and scapular regions to decrease pain and fascial restrictions and increase joint ROM               OT Short Term Goals - 01/24/20 2505      OT SHORT TERM GOAL #1   Title  Pt will be provided with and educated on HEP to improve mobility required for RUE use during ADLs.    Time  4    Period  Weeks    Status  Achieved    Target Date  12/27/19      OT SHORT TERM GOAL #2   Title  Pt will increase RUE P/ROM to [facility]  to improve ability to perform dressing tasks with minimal compensatory techniques.    Time  4    Period  Weeks    Status  On-going      OT SHORT TERM GOAL #3   Title  Pt will increase RUE strength to 3+/5 to improve ability to use RUE for tasks completed at waist to chest height.    Time  4    Period  Weeks    Status  Achieved        OT Long Term Goals - 01/24/20 3976      OT LONG TERM GOAL #1   Title  Pt will return to highest level of functioning during ADL and leisure tasks using RUE as non-dominant.    Time  8    Period  Weeks    Status  On-going      OT LONG TERM GOAL #2   Title  Pt will decrease pain in RUE to 3/10 or less to improve ability to sleep for 4 consecutive hours or greater.    Time  8    Period  Weeks    Status  On-going      OT LONG TERM GOAL #3   Title  Pt will increase RUE A/ROM to [facility] to improve ability to perform reaching tasks overhead and behind back.    Time  8    Period  Weeks    Status  On-going      OT LONG TERM GOAL #4   Title  Pt will decrease fascial restrictions in RUE to minimal amounts or less to improve mobility required for functional reaching tasks.    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    Time  8  Period  Weeks    Status  On-going            Plan - 02/06/20 4782    Clinical Impression Statement  A: Pt reports the MD gave her additional pain medication but didn't really look at her arm or ROM. Continued with manual therapy, pt with fascial restrictions primarily at trapezius and around scapular regions. Discontinued passive stretching due to pt resisting, instead focusing on self-stretching in standing. Continued with A/ROM and functional reaching, continues to be limited above 50% range. Verbal cuing for form and technique.    Body Structure /  Function / Physical Skills  ADL;Endurance;Muscle spasms;UE functional use;Fascial restriction;Pain;ROM;IADL;Strength    Plan  P: Continue with manual therapy targeting scapular regions, continue with functional reaching and attempt ball roll up wall    OT Home Exercise Plan  11/27/19: table slides; 12/06/19: scapular A/ROM extension and row; 3/18: standing isometrics; 3/25: AA/ROM exercises; 3/30: instructed to begin AA/ROM in standing; 4/15: shoulder stretches    Consulted and Agree with Plan of Care  Patient       Patient will benefit from skilled therapeutic intervention in order to improve the following deficits and impairments:   Body Structure / Function / Physical Skills: ADL, Endurance, Muscle spasms, UE functional use, Fascial restriction, Pain, ROM, IADL, Strength       Visit Diagnosis: Acute pain of right shoulder  Stiffness of right shoulder, not elsewhere classified  Other symptoms and signs involving the musculoskeletal system    Problem List Patient Active Problem List   Diagnosis Date Noted  . S/P rotator cuff repair right 11/15/19 11/20/2019  . Nontraumatic incomplete tear of right rotator cuff   . Elevated hemoglobin A1c 12/26/2018  . Centrilobular emphysema (HCC) 12/12/2018  . Moderate recurrent major depression (HCC) 12/12/2018  . Elevated BP without diagnosis of hypertension 12/12/2018  . Hyperlipidemia 02/18/2017  . Psoriasis 11/11/2016  . Mild intermittent asthma 10/31/2015  . History of lymphadenitis 10/31/2015  . Tobacco abuse 10/31/2015  . Vitamin D deficiency 10/31/2015  . Allergic rhinitis due to pollen 10/31/2015   Ezra Sites, OTR/L  [phone] 02/06/2020, 9:43 AM  [facility] [address] [place], [place], [address] Phone: [phone]   Fax:  [phone]  Name: Jacqueline [name] MRN: [id number] Date of Birth: 09-04-1961

## 2020-02-14 ENCOUNTER — Ambulatory Visit (HOSPITAL_COMMUNITY): Payer: 59 | Admitting: Occupational Therapy

## 2020-02-14 ENCOUNTER — Encounter (HOSPITAL_COMMUNITY): Payer: Self-pay | Admitting: Occupational Therapy

## 2020-02-14 ENCOUNTER — Other Ambulatory Visit: Payer: Self-pay

## 2020-02-14 DIAGNOSIS — M25611 Stiffness of right shoulder, not elsewhere classified: Secondary | ICD-10-CM

## 2020-02-14 DIAGNOSIS — R29898 Other symptoms and signs involving the musculoskeletal system: Secondary | ICD-10-CM

## 2020-02-14 DIAGNOSIS — M25511 Pain in right shoulder: Secondary | ICD-10-CM | POA: Diagnosis not present

## 2020-02-14 NOTE — Therapy (Signed)
[facility] [address] [place], [place], [address] Phone: [phone]   Fax:  [phone]  Occupational Therapy Treatment  Patient Details  Name: Jacqueline [name] MRN: [id number] Date of Birth: [birth year] Referring Provider (OT): Dr. Fuller Canada   Encounter Date: 02/14/2020  OT End of Session - 02/14/20 0908    Visit Number  18    Number of Visits  21    Date for OT Re-Evaluation  03/07/20    Authorization Type  [employer]    Authorization Time Period  $25 copay, 60 visit limit    Authorization - Visit Number  18    Authorization - Number of Visits  60    OT Start Time  0901    OT Stop Time  0941    OT Time Calculation (min)  40 min    Activity Tolerance  Patient tolerated treatment well    Behavior During Therapy  [facility] for tasks assessed/performed       Past Medical History:  Diagnosis Date  . Arthritis   . Asthma   . Cataract   . Complication of anesthesia    Fever after all surgeries  . Depression   . Joint pain   . Nausea   . Smoker     Past Surgical History:  Procedure Laterality Date  . ABDOMINAL HYSTERECTOMY  2000  . APPENDECTOMY  1981  . CATARACT EXTRACTION W/ INTRAOCULAR LENS IMPLANT Left   . SHOULDER ARTHROSCOPY WITH ROTATOR CUFF REPAIR Right 11/15/2019   Procedure: SHOULDER ARTHROSCOPY WITH ROTATOR CUFF REPAIR;  Surgeon: Vickki Hearing, MD;  Location: [facility];  Service: Orthopedics;  Laterality: Right;  . TONSILLECTOMY     59 years old    There were no vitals filed for this visit.  Subjective Assessment - 02/14/20 0900    Subjective   S: "If it is irritating me, I'll put some heat on it. I have been using help cream and it has been helping alot."    Currently in Pain?  No/denies    Pain Location  Shoulder    Pain Orientation  Right    Pain Descriptors / Indicators  Sore    Pain Type  Acute pain    Pain Radiating Towards  N/A    Pain Onset  In the past 7 days    Pain Frequency  Constant          [facility] OT Assessment - 02/14/20 0900      Assessment   Medical Diagnosis  s/p right mini-open RCR with arthroscopy      Precautions   Precautions  Shoulder    Type of Shoulder Precautions  Progress as tolerated               OT Treatments/Exercises (OP) - 02/14/20 0914      Exercises   Exercises  Shoulder      Shoulder Exercises: Supine   Protraction  AROM;12 reps    Horizontal ABduction  AROM;12 reps    External Rotation  AROM;12 reps    Internal Rotation  AROM;12 reps    Flexion  AROM;12 reps    ABduction  AROM;12 reps      Shoulder Exercises: Standing   Protraction  AROM;10 reps;Theraband    Theraband Level (Shoulder Protraction)  Level 2 (Red)    Row  --    Theraband Level (Shoulder Row)  --    Retraction  Theraband;10 reps    Theraband Level (Shoulder Retraction)  Level 2 (Red)    Other Standing Exercises  Overhead reach. placing and taking down suction cups at door; 17 suction cups. rest break between on/doff.       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 10X each    Other ROM/Strengthening Exercises  Proximal shoulder strength on wall with wash clothe; 1 min flexion; small circles      Shoulder Exercises: Stretch   Internal Rotation Stretch  2 reps   20" horizontal towel   External Rotation Stretch  2 reps;30 seconds    Wall Stretch - Flexion  2 reps;30 seconds    Other Shoulder Stretches  Large therapy ball rolling up and down wall for flexion stretch. Hold in flexion for 2 secons. 10 reps      Manual Therapy   Manual Therapy  Myofascial release    Manual therapy comments  completed separately from therapeutic exercises    Myofascial Release  Myofascial release and manual techniques to right upper arm, trapezius, and scapular regions  to decrease pain and fascial restrictions and increase joint ROM               OT Short Term Goals - 01/24/20 4270      OT SHORT TERM GOAL #1   Title  Pt will be provided with and educated on HEP to improve mobility required for RUE use during ADLs.    Time  4    Period  Weeks    Status  Achieved    Target Date  12/27/19      OT SHORT TERM GOAL #2   Title  Pt will increase RUE P/ROM to [facility] to improve ability to perform dressing tasks with minimal compensatory techniques.    Time  4    Period  Weeks    Status  On-going      OT SHORT TERM GOAL #3   Title  Pt will increase RUE strength to 3+/5 to improve ability to use RUE for tasks completed at waist to chest height.    Time  4    Period  Weeks    Status  Achieved        OT Long Term Goals - 01/24/20 6237      OT LONG TERM GOAL #1   Title  Pt will return to highest level of functioning during ADL and leisure tasks using RUE as non-dominant.    Time  8    Period  Weeks    Status  On-going      OT LONG TERM GOAL #2   Title  Pt will decrease pain in RUE to 3/10 or less to improve ability to sleep for 4 consecutive hours or greater.    Time  8    Period  Weeks    Status  On-going      OT LONG TERM GOAL #3   Title  Pt will increase RUE A/ROM to [facility] to improve ability to perform reaching tasks overhead and behind back.    Time  8    Period  Weeks    Status  On-going      OT LONG TERM GOAL #4   Title  Pt will decrease fascial restrictions in RUE to minimal amounts or less to improve  mobility required for functional reaching tasks.    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 Continued with manual therapy this session, noting decreased fascial restrictions in scapular regions. Session working on A/ROM and functional reaching, added ball roll on the wall. Verbal cuing for form and technique.    Body Structure / Function / Physical Skills  ADL;Endurance;Muscle spasms;UE functional use;Fascial restriction;Pain;ROM;IADL;Strength    Plan  P: Continue with funcitonal reaching, er/IR work, and ball roll. Add theraband strengthening in standing as able to tolerate    OT Home Exercise Plan  11/27/19: table slides; 12/06/19: scapular A/ROM extension and row; 3/18: standing isometrics; 3/25: AA/ROM exercises; 3/30: instructed to begin AA/ROM in standing; 4/15: shoulder stretches    Consulted and Agree with Plan of Care  Patient       Patient will benefit from skilled therapeutic intervention in order to improve the following deficits and impairments:   Body Structure / Function / Physical Skills: ADL, Endurance, Muscle spasms, UE functional use, Fascial restriction, Pain, ROM, IADL, Strength       Visit Diagnosis: Acute pain of right shoulder  Stiffness of right shoulder, not elsewhere classified  Other symptoms and signs involving the musculoskeletal system    Problem List Patient Active Problem List   Diagnosis Date Noted  . S/P rotator cuff repair right 11/15/19 11/20/2019  . Nontraumatic incomplete tear of right rotator cuff   . Elevated hemoglobin A1c 12/26/2018  . Centrilobular emphysema ([place]) 12/12/2018  . Moderate recurrent major depression ([place]) 12/12/2018  . Elevated BP without diagnosis of hypertension 12/12/2018  . Hyperlipidemia 02/18/2017  . Psoriasis 11/11/2016  . Mild intermittent asthma 10/31/2015  . History of lymphadenitis 10/31/2015  . Tobacco abuse 10/31/2015  . Vitamin D deficiency 10/31/2015  . Allergic rhinitis due to pollen 10/31/2015   Guadelupe Sabin, OTR/L  [phone] 02/14/2020, 9:50 AM  [facility] [address] [place], [place], [address] Phone: [phone]   Fax:  [phone]  Name: Jacqueline [name] MRN: [id number] Date of Birth: June 26, 1961

## 2020-02-21 ENCOUNTER — Ambulatory Visit (HOSPITAL_COMMUNITY): Payer: 59 | Admitting: Occupational Therapy

## 2020-02-21 ENCOUNTER — Other Ambulatory Visit: Payer: Self-pay

## 2020-02-21 ENCOUNTER — Encounter (HOSPITAL_COMMUNITY): Payer: Self-pay | Admitting: Occupational Therapy

## 2020-02-21 DIAGNOSIS — M25511 Pain in right shoulder: Secondary | ICD-10-CM | POA: Diagnosis not present

## 2020-02-21 DIAGNOSIS — R29898 Other symptoms and signs involving the musculoskeletal system: Secondary | ICD-10-CM

## 2020-02-21 DIAGNOSIS — M25611 Stiffness of right shoulder, not elsewhere classified: Secondary | ICD-10-CM

## 2020-02-21 NOTE — Patient Instructions (Signed)
Scapular Theraband Exercises   1) (Home) Extension: Isometric / Bilateral Arm Retraction - Sitting   Facing anchor, hold hands and elbow at shoulder height, with elbow bent.  Pull arms back to squeeze shoulder blades together. Repeat 10-15 times. 1-3 times/day.   2) (Clinic) Extension / Flexion (Assist)   Face anchor, pull arms back, keeping elbow straight, and squeze shoulder blades together. Repeat 10-15 times. 1-3 times/day.   Copyright  [employer]. All rights reserved.   3) (Home) Retraction: Row - Bilateral (Anchor)   Facing anchor, arms reaching forward, pull hands toward stomach, keeping elbows bent and at your sides and pinching shoulder blades together. Repeat 10-15 times. 1-3 times/day.   Copyright  VHI. All rights reserved.      Theraband shoulder strengthening: Complete 10-15X, 1-2X/day  1) Shoulder protraction  Anchor band in doorway, stand with back to door. Push your hand forward as much as you can to bringing your shoulder blades forward on your rib cage.      2) Shoulder horizontal abduction  Standing with a theraband anchored at chest height, begin with arm straight and some tension in the band. Move your arm out to your side (keeping straight the whole time). Bring the affected arm back to midline.     3) Shoulder Internal Rotation  While holding an elastic band at your side with your elbow bent, start with your hand away from your stomach, then pull the band towards your stomach. Keep your elbow near your side the entire time.     4) Shoulder External Rotation  While holding an elastic band at your side with your elbow bent, start with your hand near your stomach and then pull the band away. Keep your elbow at your side the entire time.     5) Shoulder flexion  While standing with back to the door, holding Theraband at hand level, raise arm in front of you.  Keep elbow straight through entire movement.      6) Shoulder abduction  While  holding an elastic band at your side, draw up your arm to the side keeping your elbow straight.

## 2020-02-21 NOTE — Therapy (Signed)
[facility] [address] [place], [place], [address] Phone: [phone]   Fax:  [phone]  Occupational Therapy Treatment  Patient Details  Name: Jacqueline [name] MRN: [id number] Date of Birth: 03-Jan-1961 Referring Provider (OT): Dr. Fuller Canada   Encounter Date: 02/21/2020  OT End of Session - 02/21/20 0929    Visit Number  19    Number of Visits  21    Date for OT Re-Evaluation  03/07/20    Authorization Type  U[employer]    Authorization Time Period  $25 copay, 60 visit limit    Authorization - Visit Number  19    Authorization - Number of Visits  60    OT Start Time  0847    OT Stop Time  0927    OT Time Calculation (min)  40 min    Activity Tolerance  Patient tolerated treatment well    Behavior During Therapy  [facility] for tasks assessed/performed       Past Medical History:  Diagnosis Date  . Arthritis   . Asthma   . Cataract   . Complication of anesthesia    Fever after all surgeries  . Depression   . Joint pain   . Nausea   . Smoker     Past Surgical History:  Procedure Laterality Date  . ABDOMINAL HYSTERECTOMY  2000  . APPENDECTOMY  1981  . CATARACT EXTRACTION W/ INTRAOCULAR LENS IMPLANT Left   . SHOULDER ARTHROSCOPY WITH ROTATOR CUFF REPAIR Right 11/15/2019   Procedure: SHOULDER ARTHROSCOPY WITH ROTATOR CUFF REPAIR;  Surgeon: Vickki Hearing, MD;  Location: [facility];  Service: Orthopedics;  Laterality: Right;  . TONSILLECTOMY     59 years old    There were no vitals filed for this visit.  Subjective Assessment - 02/21/20 0846    Subjective   S: I've only had to use the heat twice.    Currently in Pain?  No/denies         [facility] OT Assessment - 02/21/20 0846      Assessment   Medical Diagnosis  s/p right mini-open RCR with arthroscopy      Precautions   Precautions  Shoulder    Type of Shoulder Precautions  Progress as tolerated               OT Treatments/Exercises (OP) - 02/21/20 0846       Exercises   Exercises  Shoulder      Shoulder Exercises: Supine   Protraction  Strengthening;10 reps    Protraction Weight (lbs)  1    Horizontal ABduction  Strengthening;10 reps    Horizontal ABduction Weight (lbs)  1    External Rotation  Strengthening;10 reps    External Rotation Weight (lbs)  1    Internal Rotation  Strengthening;10 reps    Internal Rotation Weight (lbs)  1    Flexion  Strengthening;10 reps    Shoulder Flexion Weight (lbs)  1    ABduction  Strengthening;10 reps    Shoulder ABduction Weight (lbs)  1      Shoulder Exercises: Standing   Protraction  Theraband;10 reps    Theraband Level (Shoulder Protraction)  Level 2 (Red)    Horizontal ABduction  Theraband;10 reps    Theraband Level (Shoulder Horizontal ABduction)  Level 2 (Red)    External Rotation  Theraband;10 reps    Theraband Level (Shoulder External Rotation)  Level 2 (Red)    Internal Rotation  Theraband;10 reps  Theraband Level (Shoulder Internal Rotation)  Level 2 (Red)    Flexion  Theraband;10 reps    Theraband Level (Shoulder Flexion)  Level 2 (Red)    ABduction  Theraband;10 reps    Theraband Level (Shoulder ABduction)  Level 2 (Red)    Extension  Theraband;10 reps    Theraband Level (Shoulder Extension)  Level 2 (Red)    Row  Theraband;10 reps    Theraband Level (Shoulder Row)  Level 2 (Red)    Retraction  Theraband;10 reps    Theraband Level (Shoulder Retraction)  Level 2 (Red)      Shoulder Exercises: ROM/Strengthening   X to V Arms  10X   50% ROM   Other ROM/Strengthening Exercises  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 10X each      Shoulder Exercises: Stretch   Internal Rotation Stretch  2 reps   30" holds horizontal towel   External Rotation Stretch  2 reps;30 seconds    Wall Stretch - Flexion  2 reps;30 seconds    Other Shoulder Stretches  Large therapy ball rolling up and down wall for flexion stretch. Hold in flexion for 2 seconds. 10 reps      Manual  Therapy   Manual Therapy  Myofascial release    Manual therapy comments  completed separately from therapeutic exercises    Myofascial Release  Myofascial release and manual techniques to right upper arm, trapezius, and scapular regions to decrease pain and fascial restrictions and increase joint ROM             OT Education - 02/21/20 0903    Education Details  scapular theraband and theraband shoulder strengthening, red theraband    Person(s) Educated  Patient    Methods  Explanation;Demonstration;Handout    Comprehension  Verbalized understanding;Returned demonstration       OT Short Term Goals - 01/24/20 0928      OT SHORT TERM GOAL #1   Title  Pt will be provided with and educated on HEP to improve mobility required for RUE use during ADLs.    Time  4    Period  Weeks    Status  Achieved    Target Date  12/27/19      OT SHORT TERM GOAL #2   Title  Pt will increase RUE P/ROM to [facility] to improve ability to perform dressing tasks with minimal compensatory techniques.    Time  4    Period  Weeks    Status  On-going      OT SHORT TERM GOAL #3   Title  Pt will increase RUE strength to 3+/5 to improve ability to use RUE for tasks completed at waist to chest height.    Time  4    Period  Weeks    Status  Achieved        OT Long Term Goals - 01/24/20 1517      OT LONG TERM GOAL #1   Title  Pt will return to highest level of functioning during ADL and leisure tasks using RUE as non-dominant.    Time  8    Period  Weeks    Status  On-going      OT LONG TERM GOAL #2   Title  Pt will decrease pain in RUE to 3/10 or less to improve ability to sleep for 4 consecutive hours or greater.    Time  8    Period  Weeks    Status  On-going      OT LONG TERM GOAL #3   Title  Pt will increase RUE A/ROM to [facility] to improve ability to perform reaching tasks overhead and behind back.    Time  8    Period  Weeks    Status  On-going      OT LONG TERM GOAL #4   Title  Pt will  decrease fascial restrictions in RUE to minimal amounts or less to improve mobility required for functional reaching tasks.    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    Time  8    Period  Weeks    Status  On-going            Plan - 02/21/20 1245    Clinical Impression Statement  A: Pt reports improvement in pain this week, decreasing need for heat. Continued with manual techniques this session, added 1# weights in supine and red theraband strengthening in standing to begin strengthening within available ROM. Pt with A/ROM at ~65-75% range, limited to 50% with x to v arms. Verbal cuing for form and technique.    Body Structure / Function / Physical Skills  ADL;Endurance;Muscle spasms;UE functional use;Fascial restriction;Pain;ROM;IADL;Strength    Plan  P: Reassessment, determine if ready for discharge with HEP for strengthening. Follow up on HEP    OT Home Exercise Plan  11/27/19: table slides; 12/06/19: scapular A/ROM extension and row; 3/18: standing isometrics; 3/25: AA/ROM exercises; 3/30: instructed to begin AA/ROM in standing; 4/15: shoulder stretches; 5/27: red scapular theraband and shoulder strengthening    Consulted and Agree with Plan of Care  Patient       Patient will benefit from skilled therapeutic intervention in order to improve the following deficits and impairments:   Body Structure / Function / Physical Skills: ADL, Endurance, Muscle spasms, UE functional use, Fascial restriction, Pain, ROM, IADL, Strength       Visit Diagnosis: Acute pain of right shoulder  Stiffness of right shoulder, not elsewhere classified  Other symptoms and signs involving the musculoskeletal system    Problem List Patient Active Problem List   Diagnosis Date Noted  . S/P rotator cuff repair right 11/15/19  11/20/2019  . Nontraumatic incomplete tear of right rotator cuff   . Elevated hemoglobin A1c 12/26/2018  . Centrilobular emphysema (HCC) 12/12/2018  . Moderate recurrent major depression (HCC) 12/12/2018  . Elevated BP without diagnosis of hypertension 12/12/2018  . Hyperlipidemia 02/18/2017  . Psoriasis 11/11/2016  . Mild intermittent asthma 10/31/2015  . History of lymphadenitis 10/31/2015  . Tobacco abuse 10/31/2015  . Vitamin D deficiency 10/31/2015  . Allergic rhinitis due to pollen 10/31/2015   Ezra Sites, OTR/L  [phone] 02/21/2020, 9:31 AM  [facility] [address] [place], [place], [address] Phone: [phone]   Fax:  [phone]  Name: LEVETA WAHAB MRN: [id number] Date of Birth: 10/16/1960

## 2020-02-27 ENCOUNTER — Ambulatory Visit (HOSPITAL_COMMUNITY): Payer: 59 | Admitting: Occupational Therapy

## 2020-03-04 ENCOUNTER — Ambulatory Visit (HOSPITAL_COMMUNITY): Payer: 59 | Attending: Orthopedic Surgery | Admitting: Occupational Therapy

## 2020-03-04 DIAGNOSIS — M25611 Stiffness of right shoulder, not elsewhere classified: Secondary | ICD-10-CM | POA: Insufficient documentation

## 2020-03-04 DIAGNOSIS — R29898 Other symptoms and signs involving the musculoskeletal system: Secondary | ICD-10-CM | POA: Insufficient documentation

## 2020-03-04 DIAGNOSIS — M25511 Pain in right shoulder: Secondary | ICD-10-CM | POA: Insufficient documentation

## 2020-03-11 ENCOUNTER — Ambulatory Visit (HOSPITAL_COMMUNITY): Payer: 59 | Admitting: Physical Therapy

## 2020-03-13 ENCOUNTER — Ambulatory Visit (HOSPITAL_COMMUNITY): Payer: 59 | Admitting: Specialist

## 2020-03-13 ENCOUNTER — Other Ambulatory Visit: Payer: Self-pay

## 2020-03-13 ENCOUNTER — Encounter (HOSPITAL_COMMUNITY): Payer: Self-pay | Admitting: Specialist

## 2020-03-13 DIAGNOSIS — R29898 Other symptoms and signs involving the musculoskeletal system: Secondary | ICD-10-CM

## 2020-03-13 DIAGNOSIS — M25611 Stiffness of right shoulder, not elsewhere classified: Secondary | ICD-10-CM | POA: Diagnosis present

## 2020-03-13 DIAGNOSIS — M25511 Pain in right shoulder: Secondary | ICD-10-CM

## 2020-03-13 NOTE — Therapy (Addendum)
[place], [place], [address] Phone: [phone]   Fax:  [phone]  Occupational Therapy Treatment  Patient Details  Name: Jacqueline [name] MRN: [id number] Date of Birth: April 15, 1961 Referring Provider (OT): Dr. Arther Abbott   Encounter Date: 03/13/2020   OT End of Session - 03/13/20 1707    Date for OT Re-Evaluation 03/13/20    Authorization Type UHC    Authorization Time Period $25 copay, 60 visit limit    Authorization - Visit Number 20    Authorization - Number of Visits 76    OT Start Time 8299    OT Stop Time 1630    OT Time Calculation (min) 20 min    Activity Tolerance Patient tolerated treatment well    Behavior During Therapy [facility] for tasks assessed/performed           Past Medical History:  Diagnosis Date  . Arthritis   . Asthma   . Cataract   . Complication of anesthesia    Fever after all surgeries  . Depression   . Joint pain   . Nausea   . Smoker     Past Surgical History:  Procedure Laterality Date  . ABDOMINAL HYSTERECTOMY  2000  . APPENDECTOMY  1981  . CATARACT EXTRACTION W/ INTRAOCULAR LENS IMPLANT Left   . SHOULDER ARTHROSCOPY WITH ROTATOR CUFF REPAIR Right 11/15/2019   Procedure: SHOULDER ARTHROSCOPY WITH ROTATOR CUFF REPAIR;  Surgeon: Carole Civil, MD;  Location: [facility];  Service: Orthopedics;  Laterality: Right;  . TONSILLECTOMY     59 years old    There were no vitals filed for this visit.   Subjective Assessment - 03/13/20 1706    Subjective  S:  Im ready to be discharged.    Special Tests FOTO 66/100    Currently in Pain? No/denies              [facility] OT Assessment - 03/13/20 0001      Assessment   Medical Diagnosis s/p right mini-open RCR with arthroscopy      Precautions   Precautions Shoulder    Type of Shoulder Precautions Progress as tolerated      Prior Function   Level of Independence Independent    Vocation Retired    Leisure Owns  a farm-cows, horses, goats, Sales promotion account executive, dogs, cat      ADL   ADL comments able to complete all desired activites at shoulder height, difficulty reaching behind back and can compensate for independence       Observation/Other Assessments   Focus on Therapeutic Outcomes (FOTO)  66/100      AROM   Right Shoulder Flexion 110 Degrees   105   Right Shoulder ABduction 115 Degrees   108   Right Shoulder Internal Rotation 90 Degrees   90   Right Shoulder External Rotation 64 Degrees   64     PROM   Overall PROM Comments WFL       Strength   Right Shoulder Flexion 4+/5   4-/5   Right Shoulder ABduction 4+/5   3-/5   Right Shoulder Internal Rotation 5/5   4+/5   Right Shoulder External Rotation 5/5   4-/5                           OT Education - 03/13/20 1706    Education Details reviewed current HEP recommended continuing current program with focus  on stretching daily, added stargazer stretch    Person(s) Educated Patient    Methods Explanation    Comprehension Verbalized understanding;Returned demonstration            OT Short Term Goals - 03/13/20 1709      OT SHORT TERM GOAL #1   Title Pt will be provided with and educated on HEP to improve mobility required for RUE use during ADLs.    Time 4    Period Weeks    Status Achieved    Target Date 12/27/19      OT SHORT TERM GOAL #2   Title Pt will increase RUE P/ROM to [facility] to improve ability to perform dressing tasks with minimal compensatory techniques.    Time 4    Period Weeks    Status On-going      OT SHORT TERM GOAL #3   Title Pt will increase RUE strength to 3+/5 to improve ability to use RUE for tasks completed at waist to chest height.    Time 4    Period Weeks    Status Achieved             OT Long Term Goals - 03/13/20 1709      OT LONG TERM GOAL #1   Title Pt will return to highest level of functioning during ADL and leisure tasks using RUE as non-dominant.    Time 8    Period Weeks      Status Achieved      OT LONG TERM GOAL #2   Title Pt will decrease pain in RUE to 3/10 or less to improve ability to sleep for 4 consecutive hours or greater.    Time 8    Period Weeks    Status Achieved      OT LONG TERM GOAL #3   Title Pt will increase RUE A/ROM to [facility] to improve ability to perform reaching tasks overhead and behind back.    Time 8    Period Weeks    Status Achieved      OT LONG TERM GOAL #4   Title Pt will decrease fascial restrictions in RUE to minimal amounts or less to improve mobility required for functional reaching tasks.    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    Time 8    Period Weeks    Status Achieved                 Plan - 03/13/20 1707    Clinical Impression Statement A:  Patient is satisfied with current functional status.  Patient states she is able to complete all desired functional tasks and is ready to be dc from skilled OT intervention.    Occupational performance deficits (Please refer to evaluation for details): ADL's;IADL's;Rest and Sleep;Work;Leisure    OT Frequency 1x / week    OT Duration --   1 week   Plan P: DC from OT per patient request.           Patient will benefit from skilled therapeutic intervention in order to improve the following deficits and impairments:           Visit Diagnosis: Acute pain of right shoulder - Plan: Ot plan of care cert/re-cert  Stiffness of right shoulder, not elsewhere classified - Plan: Ot plan of care cert/re-cert  Other symptoms and signs involving the musculoskeletal system - Plan: Ot plan of care cert/re-cert    Problem List Patient Active Problem List   Diagnosis Date Noted  . S/P rotator cuff repair right 11/15/19 11/20/2019  . Nontraumatic incomplete tear of right rotator cuff   . Elevated  hemoglobin A1c 12/26/2018  . Centrilobular emphysema ([place]) 12/12/2018  . Moderate recurrent major depression ([place]) 12/12/2018  . Elevated BP without diagnosis of hypertension 12/12/2018  . Hyperlipidemia 02/18/2017  . Psoriasis 11/11/2016  . Mild intermittent asthma 10/31/2015  . History of lymphadenitis 10/31/2015  . Tobacco abuse 10/31/2015  . Vitamin D deficiency 10/31/2015  . Allergic rhinitis due to pollen 10/31/2015    Vangie Bicker, MHA, OTR/L [phone] OCCUPATIONAL THERAPY DISCHARGE SUMMARY  Visits from Start of Care: 20  Current functional level related to goals / functional outcomes: See above  Remaining deficits: See above  Education / Equipment: See above  Plan: Patient agrees to discharge.  Patient goals were met. Patient is being discharged due to meeting the stated rehab goals.  ?????    Vangie Bicker, [place], OTR/L [phone]  03/13/2020, 5:13 PM  [place] [address] [place], [place], [address] Phone: [phone]   Fax:  [phone]  Name: Jacqueline [name] MRN: [id number] Date of Birth: 1961/06/01

## 2020-04-02 ENCOUNTER — Ambulatory Visit (INDEPENDENT_AMBULATORY_CARE_PROVIDER_SITE_OTHER): Payer: 59 | Admitting: Orthopedic Surgery

## 2020-04-02 ENCOUNTER — Other Ambulatory Visit: Payer: Self-pay

## 2020-04-02 ENCOUNTER — Encounter: Payer: Self-pay | Admitting: Orthopedic Surgery

## 2020-04-02 VITALS — Ht 67.0 in | Wt 190.0 lb

## 2020-04-02 DIAGNOSIS — Z9889 Other specified postprocedural states: Secondary | ICD-10-CM | POA: Diagnosis not present

## 2020-04-02 NOTE — Progress Notes (Signed)
Chief Complaint  Patient presents with  . Follow-up    Recheck on right shoulder, DOS 11-15-19.    Status post cuff repair on February 18.  Patient was somewhat noncompliant with physical therapy and lifting restrictions comes in with a stiff shoulder but minimal pain which is located over the biceps  We observed 30 degrees external rotation 110 degrees of passive and active range of motion in flexion  Recommend passive range of motion with the pulleys and recheck in 4 months

## 2020-04-06 ENCOUNTER — Other Ambulatory Visit: Payer: Self-pay | Admitting: Orthopedic Surgery

## 2020-08-04 ENCOUNTER — Ambulatory Visit: Payer: 59 | Admitting: Orthopedic Surgery

## 2020-09-04 ENCOUNTER — Other Ambulatory Visit: Payer: Self-pay | Admitting: Orthopedic Surgery

## 2020-09-04 DIAGNOSIS — J432 Centrilobular emphysema: Secondary | ICD-10-CM

## 2020-09-04 DIAGNOSIS — J441 Chronic obstructive pulmonary disease with (acute) exacerbation: Secondary | ICD-10-CM

## 2020-09-04 MED ORDER — ALBUTEROL SULFATE HFA 108 (90 BASE) MCG/ACT IN AERS: 2.0000 | INHALATION_SPRAY | Freq: Four times a day (QID) | RESPIRATORY_TRACT | 5 refills | Status: DC | PRN

## 2020-10-31 ENCOUNTER — Other Ambulatory Visit: Payer: Self-pay | Admitting: Orthopedic Surgery

## 2021-12-28 ENCOUNTER — Other Ambulatory Visit: Payer: Self-pay | Admitting: Family Medicine

## 2021-12-28 DIAGNOSIS — J432 Centrilobular emphysema: Secondary | ICD-10-CM

## 2021-12-28 DIAGNOSIS — J441 Chronic obstructive pulmonary disease with (acute) exacerbation: Secondary | ICD-10-CM

## 2021-12-29 NOTE — Telephone Encounter (Signed)
Requested medication (s) are due for refill today: yes ? ?Requested medication (s) are on the active medication list: yes ? ?Last refill:  09/04/20 ? ?Future visit scheduled: no ? ?Notes to clinic:  Unable to refill per protocol, appointment needed. Pt called, LVMTCB ? ? ?  ?Requested Prescriptions  ?Pending Prescriptions Disp Refills  ? albuterol (VENTOLIN HFA) 108 (90 Base) MCG/ACT inhaler [Pharmacy Med Name: ALBUTEROL HFA (PROVENTIL) INH] 6.7 each 5  ?  Sig: TAKE 2 PUFFS BY MOUTH EVERY 6 HOURS AS NEEDED FOR WHEEZE OR SHORTNESS OF BREATH  ?  ? Pulmonology:  Beta Agonists 2 Failed - 12/28/2021  2:17 PM  ?  ?  Failed - Valid encounter within last 12 months  ?  Recent Outpatient Visits   ? ?      ? 2 years ago Chronic right shoulder pain  ? [facility] [place], Netta Neat, DO  ? 3 years ago Lesion of skin of breast  ? [facility] [place], Netta Neat, DO  ? 4 years ago Chronic obstructive pulmonary disease, unspecified COPD type (HCC)  ? [facility] [place], Netta Neat, DO  ? 5 years ago Rash of neck  ? [facility] [place], Netta Neat, DO  ? 5 years ago Mild intermittent asthma, with acute exacerbation  ? [facility] [place], Laurel Dimmer, NP  ? ?  ?  ? ?  ?  ?  Passed - Last BP in normal range  ?  BP Readings from Last 1 Encounters:  ?11/15/19 126/78  ?  ?  ?  ?  Passed - Last Heart Rate in normal range  ?  Pulse Readings from Last 1 Encounters:  ?11/15/19 81  ?  ?  ?  ?  ? ?

## 2021-12-29 NOTE — Telephone Encounter (Signed)
Patient called, left VM to return the call to the office to scheduled an appt for medication refill request.   

## 2022-01-08 ENCOUNTER — Encounter: Payer: Self-pay | Admitting: Family Medicine

## 2022-01-08 ENCOUNTER — Ambulatory Visit (INDEPENDENT_AMBULATORY_CARE_PROVIDER_SITE_OTHER): Payer: 59 | Admitting: Family Medicine

## 2022-01-08 ENCOUNTER — Other Ambulatory Visit: Payer: Self-pay | Admitting: Family Medicine

## 2022-01-08 VITALS — BP 126/82 | HR 83 | Ht 66.5 in | Wt 170.2 lb

## 2022-01-08 DIAGNOSIS — Z1231 Encounter for screening mammogram for malignant neoplasm of breast: Secondary | ICD-10-CM

## 2022-01-08 DIAGNOSIS — E559 Vitamin D deficiency, unspecified: Secondary | ICD-10-CM

## 2022-01-08 DIAGNOSIS — E782 Mixed hyperlipidemia: Secondary | ICD-10-CM | POA: Diagnosis not present

## 2022-01-08 DIAGNOSIS — R7309 Other abnormal glucose: Secondary | ICD-10-CM

## 2022-01-08 DIAGNOSIS — L659 Nonscarring hair loss, unspecified: Secondary | ICD-10-CM

## 2022-01-08 DIAGNOSIS — J432 Centrilobular emphysema: Secondary | ICD-10-CM | POA: Diagnosis not present

## 2022-01-08 DIAGNOSIS — Z0000 Encounter for general adult medical examination without abnormal findings: Secondary | ICD-10-CM

## 2022-01-08 DIAGNOSIS — Z720 Tobacco use: Secondary | ICD-10-CM

## 2022-01-08 MED ORDER — BREZTRI AEROSPHERE 160-9-4.8 MCG/ACT IN AERO: 2.0000 | INHALATION_SPRAY | Freq: Two times a day (BID) | RESPIRATORY_TRACT | 11 refills | Status: DC

## 2022-01-08 NOTE — Patient Instructions (Addendum)
Thank you for coming to the office today. ? ?Start Breztri inhaler 2 puffs twice per day, after few days or weeks, it should help prevent symptoms. So should have limited or reduced wheezing coughing etc. ? ?You can still use the albuterol if you need it. At first may use it more, then eventually scale back to hopefully only use a few times a week if at all. ? ?Checking blood for thyroid, vitamin D. ? ?----------------------- ? ?START anti inflammatory topical - OTC Voltaren (generic Diclofenac) topical 2-4 times a day as needed for pain swelling of affected joint for 1-2 weeks or longer. ? ? ?Mammogram ordered ? ? ?[facility] ?[address] ?[place],  [place]  [address] ?Get Driving Directions ?Main: [phone] ? ?Mammography Hours:  ? ?Monday 7a.m. - 7p.m.  Tues.-Thurs. 7a.m. - 5p.m.  Friday 7a.m. - 3p.m. ? ?DUE for FASTING BLOOD WORK (no food or drink after midnight before the lab appointment, only water or coffee without cream/sugar on the morning of) ? ?SCHEDULE "Lab Only" visit in the morning at the clinic for lab draw in 3 MONTHS  ? ?- Make sure Lab Only appointment is at about 1 week before your next appointment, so that results will be available ? ?For Lab Results, once available within 2-3 days of blood draw, you can can log in to MyChart online to view your results and a brief explanation. Also, we can discuss results at next follow-up visit. ? ? ?Please schedule a Follow-up Appointment to: Return in about 3 months (around 04/09/2022) for 3 month fasting lab only then 1 week later Annual Physical. ? ?If you have any other questions or concerns, please feel free to call the office or send a message through MyChart. You may also schedule an earlier appointment if necessary. ? ?Additionally, you may be receiving a survey about your experience at our office within a few days to 1 week by e-mail or mail. We value your feedback. ? ?Saralyn Pilar, DO ?[facility] [place] ?

## 2022-01-08 NOTE — Progress Notes (Signed)
? ?Subjective:  ? ? Patient ID: Jacqueline DownsVictoria M List, female    DOB: 12/07/1960, 61 y.o.   MRN: [id number] ? ?Jacqueline [name] is a 61 y.o. female presenting on 01/08/2022 for COPD ? ? ?HPI ? ?Centrilobular Emphysema (COPD) / Tobacco Abuse ? ?She has retired and now living [place] farm in [place] ? ?Chronic problem with breathing, describes wheezing in AM and PM. Request inhaler re order. Has albuterol using BID. Says has coughing spell at times. But overall doing fairly well. Admits some allergy triggers. ? ?Still smoking. Not ready to quit 0.5 to 1ppd ? ?Denies any fevers, chills, sweats, sick contacts, travel to high risk areas of concern for COVID19 ? ?Hair Loss ?Asking about labs today ?Taking Vitamin dose. ? ?Health Maintenance: ?Mammogram ordered for [facility]. Last 2020 ? ? ?  01/08/2022  ?  8:49 AM 10/03/2019  ?  8:03 AM 12/12/2018  ?  2:45 PM  ?Depression screen PHQ 2/9  ?Decreased Interest 0 0 1  ?Down, Depressed, Hopeless 0 0 0  ?PHQ - 2 Score 0 0 1  ?Altered sleeping 0 0 2  ?Tired, decreased energy 0 0 3  ?Change in appetite 0 0 1  ?Feeling bad or failure about yourself  0 0 0  ?Trouble concentrating 0 0 1  ?Moving slowly or fidgety/restless 0 0 0  ?Suicidal thoughts 0 0 0  ?PHQ-9 Score 0 0 8  ?Difficult doing work/chores Not difficult at all Not difficult at all Not difficult at all  ? ? ?Social History  ? ?Tobacco Use  ? Smoking status: Every Day  ?  Packs/day: 1.00  ?  Years: 34.00  ?  Pack years: 34.00  ?  Types: Cigarettes  ? Smokeless tobacco: Never  ? Tobacco comments:  ?  Failed Chantix in past  ?Vaping Use  ? Vaping Use: Never used  ?Substance Use Topics  ? Alcohol use: Not Currently  ?  Alcohol/week: 2.0 standard drinks  ?  Types: 2 Glasses of wine per week  ?  Comment: glass of wine once a month  ? Drug use: No  ? ? ?Review of Systems ?Per HPI unless specifically indicated above ? ?   ?Objective:  ?  ?BP 126/82   Pulse 83   Ht 5' 6.5" (1.689 m)   Wt 170 lb 3.2 oz (77.2 kg)   SpO2 98%   BMI  27.06 kg/m?   ?Wt Readings from Last 3 Encounters:  ?01/08/22 170 lb 3.2 oz (77.2 kg)  ?04/02/20 190 lb (86.2 kg)  ?11/15/19 186 lb 15.2 oz (84.8 kg)  ?  ?Physical Exam ?Vitals and nursing note reviewed.  ?Constitutional:   ?   General: She is not in acute distress. ?   Appearance: She is well-developed. She is not diaphoretic.  ?   Comments: Well-appearing, comfortable, cooperative  ?HENT:  ?   Head: Normocephalic and atraumatic.  ?Eyes:  ?   General:     ?   Right eye: No discharge.     ?   Left eye: No discharge.  ?   Conjunctiva/sclera: Conjunctivae normal.  ?Neck:  ?   Thyroid: No thyromegaly.  ?Cardiovascular:  ?   Rate and Rhythm: Normal rate and regular rhythm.  ?   Heart sounds: Normal heart sounds. No murmur heard. ?Pulmonary:  ?   Effort: Pulmonary effort is normal. No respiratory distress.  ?   Breath sounds: Wheezing present. No rales.  ?Musculoskeletal:     ?  General: Normal range of motion.  ?   Cervical back: Normal range of motion and neck supple.  ?Lymphadenopathy:  ?   Cervical: No cervical adenopathy.  ?Skin: ?   General: Skin is warm and dry.  ?   Findings: No erythema or rash.  ?Neurological:  ?   Mental Status: She is alert and oriented to person, place, and time.  ?Psychiatric:     ?   Behavior: Behavior normal.  ?   Comments: Well groomed, good eye contact, normal speech and thoughts  ? ?Results for orders placed or performed during the hospital encounter of 11/13/19  ?Basic metabolic panel  ?Result Value Ref Range  ? Sodium 142 135 - 145 mmol/L  ? Potassium 4.2 3.5 - 5.1 mmol/L  ? Chloride 107 98 - 111 mmol/L  ? CO2 25 22 - 32 mmol/L  ? Glucose, Bld 96 70 - 99 mg/dL  ? BUN 17 6 - 20 mg/dL  ? Creatinine, Ser 0.71 0.44 - 1.00 mg/dL  ? Calcium 9.3 8.9 - 10.3 mg/dL  ? GFR calc non Af Amer >60 >60 mL/min  ? GFR calc Af Amer >60 >60 mL/min  ? Anion gap 10 5 - 15  ?CBC WITH DIFFERENTIAL  ?Result Value Ref Range  ? WBC 10.2 4.0 - 10.5 K/uL  ? RBC 4.52 3.87 - 5.11 MIL/uL  ? Hemoglobin 13.2 12.0 -  15.0 g/dL  ? HCT 40.8 36.0 - 46.0 %  ? MCV 90.3 80.0 - 100.0 fL  ? MCH 29.2 26.0 - 34.0 pg  ? MCHC 32.4 30.0 - 36.0 g/dL  ? RDW 12.8 11.5 - 15.5 %  ? Platelets 296 150 - 400 K/uL  ? nRBC 0.0 0.0 - 0.2 %  ? Neutrophils Relative % 56 %  ? Neutro Abs 5.7 1.7 - 7.7 K/uL  ? Lymphocytes Relative 37 %  ? Lymphs Abs 3.8 0.7 - 4.0 K/uL  ? Monocytes Relative 4 %  ? Monocytes Absolute 0.5 0.1 - 1.0 K/uL  ? Eosinophils Relative 2 %  ? Eosinophils Absolute 0.2 0.0 - 0.5 K/uL  ? Basophils Relative 1 %  ? Basophils Absolute 0.1 0.0 - 0.1 K/uL  ? Immature Granulocytes 0 %  ? Abs Immature Granulocytes 0.03 0.00 - 0.07 K/uL  ? ?   ?Assessment & Plan:  ? ?Problem List Items Addressed This Visit   ? ? Vitamin D deficiency  ? Relevant Orders  ? VITAMIN D 25 Hydroxy (Vit-D Deficiency, Fractures)  ? Tobacco abuse  ? Hyperlipidemia  ? Relevant Orders  ? TSH  ? T4, free  ? Centrilobular emphysema (HCC) - Primary  ?  Clinically with emphysema, mild ?No acute exacerbation ?Likely some mixed picture with asthma as well ?Active smoker still ?See A&P AECOPD ?No longer on Advair ? ?Start Breztri therapy 2 puff BID therapy for maintenance ?Reduce Albuterol usage to PRN only ?Follow up 3 months ?Future consider LDCT screening imaging vs Pulm ? ?  ?  ? Relevant Medications  ? BREZTRI AEROSPHERE 160-9-4.8 MCG/ACT AERO  ? ?Other Visit Diagnoses   ? ? Hair loss      ? Relevant Orders  ? TSH  ? T4, free  ? Encounter for screening mammogram for malignant neoplasm of breast      ? Relevant Orders  ? MM 3D SCREEN BREAST BILATERAL  ? ?  ?  ? ? ?Hair Loss ?Non scarring ?Discussed multiple causes can include thyroid, stress, normal aging/pattern female hair loss ?Check Thyroid and  labs Vit D ?Recommend supplements OTC ? ?Due for Mammogram - [facility] order, she can call to schedule ? ?Orders Placed This Encounter  ?Procedures  ? MM 3D SCREEN BREAST BILATERAL  ?  Standing Status:   Future  ?  Standing Expiration Date:   01/09/2023  ?  Order Specific Question:    Reason for Exam (SYMPTOM  OR DIAGNOSIS REQUIRED)  ?  Answer:   Screening bilateral 3D Mammogram Tomo  ?  Order Specific Question:   Preferred imaging location?  ?  Answer:   [facility]  ? TSH  ? T4, free  ? VITAMIN D 25 Hydroxy (Vit-D Deficiency, Fractures)  ? ? ? ? ?Meds ordered this encounter  ?Medications  ? BREZTRI AEROSPHERE 160-9-4.8 MCG/ACT AERO  ?  Sig: Inhale 2 puffs into the lungs 2 (two) times daily.  ?  Dispense:  10.7 g  ?  Refill:  11  ? ? ? ? ?Follow up plan: ?Return in about 3 months (around 04/09/2022) for 3 month fasting lab only then 1 week later Annual Physical. ? ?Future labs ordered for 04/13/22 ? ?Saralyn Pilar, DO ?[facility] ?[facility] ?01/08/2022, 9:05 AM ?

## 2022-01-08 NOTE — Assessment & Plan Note (Signed)
Clinically with emphysema, mild ?No acute exacerbation ?Likely some mixed picture with asthma as well ?Active smoker still ?See A&P AECOPD ?No longer on Advair ? ?Start Breztri therapy 2 puff BID therapy for maintenance ?Reduce Albuterol usage to PRN only ?Follow up 3 months ?Future consider LDCT screening imaging vs Pulm ?

## 2022-01-09 LAB — VITAMIN D 25 HYDROXY (VIT D DEFICIENCY, FRACTURES): Vit D, 25-Hydroxy: 31 ng/mL (ref 30–100)

## 2022-01-09 LAB — T4, FREE: Free T4: 1.3 ng/dL (ref 0.8–1.8)

## 2022-01-09 LAB — TSH: TSH: 2.76 mIU/L (ref 0.40–4.50)

## 2022-01-20 ENCOUNTER — Other Ambulatory Visit (HOSPITAL_COMMUNITY): Payer: Self-pay | Admitting: Family Medicine

## 2022-01-20 DIAGNOSIS — R928 Other abnormal and inconclusive findings on diagnostic imaging of breast: Secondary | ICD-10-CM

## 2022-03-23 ENCOUNTER — Ambulatory Visit (HOSPITAL_COMMUNITY)
Admission: RE | Admit: 2022-03-23 | Discharge: 2022-03-23 | Disposition: A | Discharge status: Home / Self Care | Payer: 59 | Source: Ambulatory Visit | Attending: Family Medicine | Admitting: Family Medicine

## 2022-03-23 ENCOUNTER — Encounter (HOSPITAL_COMMUNITY): Payer: Self-pay

## 2022-03-23 DIAGNOSIS — R928 Other abnormal and inconclusive findings on diagnostic imaging of breast: Secondary | ICD-10-CM | POA: Insufficient documentation

## 2022-04-06 ENCOUNTER — Other Ambulatory Visit: Payer: 59

## 2022-04-13 ENCOUNTER — Other Ambulatory Visit: Payer: 59

## 2022-04-13 DIAGNOSIS — J432 Centrilobular emphysema: Secondary | ICD-10-CM

## 2022-04-13 DIAGNOSIS — Z0000 Encounter for general adult medical examination without abnormal findings: Secondary | ICD-10-CM

## 2022-04-13 DIAGNOSIS — R7309 Other abnormal glucose: Secondary | ICD-10-CM

## 2022-04-13 DIAGNOSIS — E782 Mixed hyperlipidemia: Secondary | ICD-10-CM

## 2022-04-14 LAB — COMPLETE METABOLIC PANEL WITH GFR
AG Ratio: 2.1 (calc) (ref 1.0–2.5)
ALT: 12 U/L (ref 6–29)
AST: 14 U/L (ref 10–35)
Albumin: 4.4 g/dL (ref 3.6–5.1)
Alkaline phosphatase (APISO): 71 U/L (ref 37–153)
BUN: 15 mg/dL (ref 7–25)
CO2: 28 mmol/L (ref 20–32)
Calcium: 9.3 mg/dL (ref 8.6–10.4)
Chloride: 106 mmol/L (ref 98–110)
Creat: 0.84 mg/dL (ref 0.50–1.05)
Globulin: 2.1 g/dL (calc) (ref 1.9–3.7)
Glucose, Bld: 81 mg/dL (ref 65–99)
Potassium: 4.1 mmol/L (ref 3.5–5.3)
Sodium: 141 mmol/L (ref 135–146)
Total Bilirubin: 0.9 mg/dL (ref 0.2–1.2)
Total Protein: 6.5 g/dL (ref 6.1–8.1)
eGFR: 79 mL/min/{1.73_m2} (ref >=60)

## 2022-04-14 LAB — CBC WITH DIFFERENTIAL/PLATELET
Absolute Monocytes: 605 cells/uL (ref 200–950)
Basophils Absolute: 86 cells/uL (ref 0–200)
Basophils Relative: 0.9 %
Eosinophils Absolute: 240 cells/uL (ref 15–500)
Eosinophils Relative: 2.5 %
HCT: 41.6 % (ref 35.0–45.0)
Hemoglobin: 13.7 g/dL (ref 11.7–15.5)
Lymphs Abs: 3552 cells/uL (ref 850–3900)
MCH: 30.3 pg (ref 27.0–33.0)
MCHC: 32.9 g/dL (ref 32.0–36.0)
MCV: 92 fL (ref 80.0–100.0)
MPV: 9.9 fL (ref 7.5–12.5)
Monocytes Relative: 6.3 %
Neutro Abs: 5117 cells/uL (ref 1500–7800)
Neutrophils Relative %: 53.3 %
Platelets: 289 10*3/uL (ref 140–400)
RBC: 4.52 10*6/uL (ref 3.80–5.10)
RDW: 12.8 % (ref 11.0–15.0)
Total Lymphocyte: 37 %
WBC: 9.6 10*3/uL (ref 3.8–10.8)

## 2022-04-14 LAB — LIPID PANEL
Cholesterol: 205 mg/dL — ABNORMAL HIGH (ref <200)
HDL: 45 mg/dL — ABNORMAL LOW (ref >=50)
LDL Cholesterol (Calc): 121 mg/dL (calc) — ABNORMAL HIGH
Non-HDL Cholesterol (Calc): 160 mg/dL (calc) — ABNORMAL HIGH (ref <130)
Total CHOL/HDL Ratio: 4.6 (calc) (ref <5.0)
Triglycerides: 240 mg/dL — ABNORMAL HIGH (ref <150)

## 2022-04-14 LAB — HEMOGLOBIN A1C
Hgb A1c MFr Bld: 5.1 % of total Hgb (ref <5.7)
Mean Plasma Glucose: 100 mg/dL
eAG (mmol/L): 5.5 mmol/L

## 2022-04-14 LAB — TSH: TSH: 4.22 mIU/L (ref 0.40–4.50)

## 2022-04-20 ENCOUNTER — Ambulatory Visit (INDEPENDENT_AMBULATORY_CARE_PROVIDER_SITE_OTHER): Payer: 59 | Admitting: Family Medicine

## 2022-04-20 ENCOUNTER — Encounter: Payer: Self-pay | Admitting: Family Medicine

## 2022-04-20 VITALS — BP 134/70 | HR 80 | Ht 66.5 in | Wt 170.0 lb

## 2022-04-20 DIAGNOSIS — E782 Mixed hyperlipidemia: Secondary | ICD-10-CM

## 2022-04-20 DIAGNOSIS — J432 Centrilobular emphysema: Secondary | ICD-10-CM

## 2022-04-20 DIAGNOSIS — Z0000 Encounter for general adult medical examination without abnormal findings: Secondary | ICD-10-CM | POA: Diagnosis not present

## 2022-04-20 DIAGNOSIS — Z1211 Encounter for screening for malignant neoplasm of colon: Secondary | ICD-10-CM | POA: Diagnosis not present

## 2022-04-20 DIAGNOSIS — M62838 Other muscle spasm: Secondary | ICD-10-CM

## 2022-04-20 DIAGNOSIS — Z6827 Body mass index (BMI) 27.0-27.9, adult: Secondary | ICD-10-CM

## 2022-04-20 DIAGNOSIS — R030 Elevated blood-pressure reading, without diagnosis of hypertension: Secondary | ICD-10-CM

## 2022-04-20 DIAGNOSIS — F3341 Major depressive disorder, recurrent, in partial remission: Secondary | ICD-10-CM

## 2022-04-20 MED ORDER — TIZANIDINE HCL 4 MG PO TABS: 4.0000 mg | ORAL_TABLET | Freq: Three times a day (TID) | ORAL | 2 refills | Status: AC | PRN

## 2022-04-20 NOTE — Progress Notes (Signed)
Subjective:    Patient ID: Jacqueline [name], female    DOB: 12-27-1960, 61 y.o.   MRN: [id number]  Jacqueline [name] is a 61 y.o. female presenting on 04/20/2022 for Annual Exam   HPI  Here for Annual Physical and Lab Review  Centrilobular Emphysema (COPD) / Tobacco Abuse She has retired and now living on farm in [place]. She was started on [place] since 12/2021, and feels it is 100% more effective. But she does not use it every day, still using only 2-3 days a week approximately only if needed Admits allergy triggers  Still smoking. 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 Not on therapy  Additional complaint  Left Hand carpal tunnel symptoms bothering her more - asking about this if bothering her more in future  She takes Tizanidine PRN for muscle cramps and spasms, and taking magnesium   HYPERLIPIDEMIA: - Reports no concerns. Last lipid panel 03/2022, mostly stable but improved. LDL 121 and TG 240 previously 350. She is not on statin. Diet is improving overall health.     Health Maintenance: Mammogram ordered [facility]. Last 2020  Due for Cologuard or Colon CA Screening.  She has had Chicken pox before. She asks about Shingrix vaccine. She will pursue this at the pharmacy     04/20/2022    8:53 AM 01/08/2022    8:49 AM 10/03/2019    8:03 AM  Depression screen PHQ 2/9  Decreased Interest 0 0 0  Down, Depressed, Hopeless 0 0 0  PHQ - 2 Score 0 0 0  Altered sleeping 3 0 0  Tired, decreased energy 0 0 0  Change in appetite 0 0 0   Feeling bad or failure about yourself  0 0 0  Trouble concentrating 0 0 0  Moving slowly or fidgety/restless 0 0 0  Suicidal thoughts 0 0 0  PHQ-9 Score 3 0 0  Difficult doing work/chores Not difficult at all Not difficult at all Not difficult at all    Past Medical History:  Diagnosis Date   Arthritis    Asthma    Cataract    Complication of anesthesia    Fever after all surgeries   Depression    Joint pain    Nausea    Smoker    Past Surgical History:  Procedure Laterality Date   ABDOMINAL HYSTERECTOMY  2000   APPENDECTOMY  1981   CATARACT EXTRACTION W/ INTRAOCULAR LENS IMPLANT Left    SHOULDER ARTHROSCOPY WITH ROTATOR CUFF REPAIR Right 11/15/2019   Procedure: SHOULDER ARTHROSCOPY WITH ROTATOR CUFF REPAIR;  Surgeon: Carole Civil, MD;  Location: [facility];  Service: Orthopedics;  Laterality: Right;   TONSILLECTOMY     61 years old   Social History   Socioeconomic History   Marital status: Divorced    Spouse name: Not on file   Number of children: Not on file   Years of education: Not on file   Highest education level: Not on file  Occupational History   Not on file  Tobacco Use  Smoking status: Every Day    Packs/day: 1.00    Years: 34.00    Total pack years: 34.00    Types: Cigarettes   Smokeless tobacco: Never   Tobacco comments:    Failed Chantix in past  Vaping Use   Vaping Use: Never used  Substance and Sexual Activity   Alcohol use: Not Currently    Alcohol/week: 2.0 standard drinks of alcohol    Types: 2 Glasses of wine per week    Comment: glass of wine once a month   Drug use: No   Sexual activity: Yes  Other Topics Concern   Not on file  Social History Narrative   Not on file   Social Determinants of Health   Financial Resource Strain: Not on file  Food Insecurity: Not on file  Transportation Needs: Not on file  Physical Activity: Not on file  Stress: Not on file  Social Connections: Not on file  Intimate Partner Violence: Not on  file   Family History  Adopted: Yes  Problem Relation Age of Onset   Diabetes Son    Current Outpatient Medications on File Prior to Visit  Medication Sig   albuterol (VENTOLIN HFA) 108 (90 Base) MCG/ACT inhaler TAKE 2 PUFFS BY MOUTH EVERY 6 HOURS AS NEEDED FOR WHEEZE OR SHORTNESS OF BREATH   Ascorbic Acid (VITAMIN C) 1000 MG tablet Take 1,000 mg by mouth daily.   BREZTRI AEROSPHERE 160-9-4.8 MCG/ACT AERO Inhale 2 puffs into the lungs 2 (two) times daily.   cetirizine (ZYRTEC) 10 MG tablet Take 10 mg by mouth daily.   Cholecalciferol (VITAMIN D) 50 MCG (2000 UT) CAPS Take 2,000 Units by mouth daily.    ibuprofen (ADVIL) 800 MG tablet TAKE 1 TABLET BY MOUTH EVERY 8 HOURS AS NEEDED   Multiple Vitamin (MULTIVITAMIN WITH MINERALS) TABS tablet Take 1 tablet by mouth daily. Centrum Silver   No current facility-administered medications on file prior to visit.    Review of Systems  Constitutional:  Negative for activity change, appetite change, chills, diaphoresis, fatigue and fever.  HENT:  Negative for congestion and hearing loss.   Eyes:  Negative for visual disturbance.  Respiratory:  Negative for cough, chest tightness, shortness of breath and wheezing.   Cardiovascular:  Negative for chest pain, palpitations and leg swelling.  Gastrointestinal:  Negative for abdominal pain, constipation, diarrhea, nausea and vomiting.  Genitourinary:  Negative for dysuria, frequency and hematuria.  Musculoskeletal:  Negative for arthralgias and neck pain.  Skin:  Negative for rash.  Neurological:  Negative for dizziness, weakness, light-headedness, numbness and headaches.  Hematological:  Negative for adenopathy.  Psychiatric/Behavioral:  Negative for behavioral problems, dysphoric mood and sleep disturbance.    Per HPI unless specifically indicated above     Objective:    BP 134/70 (BP Location: Left Arm, Cuff Size: Normal)   Pulse 80   Ht 5' 6.5" (1.689 m)   Wt 170 lb (77.1 kg)   SpO2 99%    BMI 27.03 kg/m   Wt Readings from Last 3 Encounters:  04/20/22 170 lb (77.1 kg)  01/08/22 170 lb 3.2 oz (77.2 kg)  04/02/20 190 lb (86.2 kg)    Physical Exam Vitals and nursing note reviewed.  Constitutional:      General: She is not in acute distress.    Appearance: She is well-developed. She is not diaphoretic.     Comments: Well-appearing, comfortable, cooperative  HENT:     Head: Normocephalic and atraumatic.  Eyes:  General:        Right eye: No discharge.        Left eye: No discharge.     Conjunctiva/sclera: Conjunctivae normal.     Pupils: Pupils are equal, round, and reactive to light.  Neck:     Thyroid: No thyromegaly.  Cardiovascular:     Rate and Rhythm: Normal rate and regular rhythm.     Pulses: Normal pulses.     Heart sounds: Normal heart sounds. No murmur heard. Pulmonary:     Effort: Pulmonary effort is normal. No respiratory distress.     Breath sounds: Normal breath sounds. No wheezing or rales.  Abdominal:     General: Bowel sounds are normal. There is no distension.     Palpations: Abdomen is soft. There is no mass.     Tenderness: There is no abdominal tenderness.  Musculoskeletal:        General: No tenderness. Normal range of motion.     Cervical back: Normal range of motion and neck supple.     Right lower leg: No edema.     Left lower leg: No edema.     Comments: Upper / Lower Extremities: - Normal muscle tone, strength bilateral upper extremities 5/5, lower extremities 5/5  Lymphadenopathy:     Cervical: No cervical adenopathy.  Skin:    General: Skin is warm and dry.     Findings: No erythema or rash.  Neurological:     Mental Status: She is alert and oriented to person, place, and time.     Comments: Distal sensation intact to light touch all extremities  Psychiatric:        Mood and Affect: Mood normal.        Behavior: Behavior normal.        Thought Content: Thought content normal.     Comments: Well groomed, good eye  contact, normal speech and thoughts    Results for orders placed or performed in visit on 04/13/22  TSH  Result Value Ref Range   TSH 4.22 0.40 - 4.50 mIU/L  Hemoglobin A1c  Result Value Ref Range   Hgb A1c MFr Bld 5.1 <5.7 % of total Hgb   Mean Plasma Glucose 100 mg/dL   eAG (mmol/L) 5.5 mmol/L  Lipid panel  Result Value Ref Range   Cholesterol 205 (H) <200 mg/dL   HDL 45 (L) > OR = 50 mg/dL   Triglycerides 240 (H) <150 mg/dL   LDL Cholesterol (Calc) 121 (H) mg/dL (calc)   Total CHOL/HDL Ratio 4.6 <5.0 (calc)   Non-HDL Cholesterol (Calc) 160 (H) <130 mg/dL (calc)  CBC with Differential/Platelet  Result Value Ref Range   WBC 9.6 3.8 - 10.8 Thousand/uL   RBC 4.52 3.80 - 5.10 Million/uL   Hemoglobin 13.7 11.7 - 15.5 g/dL   HCT 41.6 35.0 - 45.0 %   MCV 92.0 80.0 - 100.0 fL   MCH 30.3 27.0 - 33.0 pg   MCHC 32.9 32.0 - 36.0 g/dL   RDW 12.8 11.0 - 15.0 %   Platelets 289 140 - 400 Thousand/uL   MPV 9.9 7.5 - 12.5 fL   Neutro Abs 5,117 1,500 - 7,800 cells/uL   Lymphs Abs 3,552 850 - 3,900 cells/uL   Absolute Monocytes 605 200 - 950 cells/uL   Eosinophils Absolute 240 15 - 500 cells/uL   Basophils Absolute 86 0 - 200 cells/uL   Neutrophils Relative % 53.3 %   Total Lymphocyte 37.0 %   Monocytes Relative 6.3 %  Eosinophils Relative 2.5 %   Basophils Relative 0.9 %  COMPLETE METABOLIC PANEL WITH GFR  Result Value Ref Range   Glucose, Bld 81 65 - 99 mg/dL   BUN 15 7 - 25 mg/dL   Creat 0.84 0.50 - 1.05 mg/dL   eGFR 79 > OR = 60 mL/min/1.100m   BUN/Creatinine Ratio NOT APPLICABLE 6 - 22 (calc)   Sodium 141 135 - 146 mmol/L   Potassium 4.1 3.5 - 5.3 mmol/L   Chloride 106 98 - 110 mmol/L   CO2 28 20 - 32 mmol/L   Calcium 9.3 8.6 - 10.4 mg/dL   Total Protein 6.5 6.1 - 8.1 g/dL   Albumin 4.4 3.6 - 5.1 g/dL   Globulin 2.1 1.9 - 3.7 g/dL (calc)   AG Ratio 2.1 1.0 - 2.5 (calc)   Total Bilirubin 0.9 0.2 - 1.2 mg/dL   Alkaline phosphatase (APISO) 71 37 - 153 U/L   AST 14 10 - 35  U/L   ALT 12 6 - 29 U/L      Assessment & Plan:   Problem List Items Addressed This Visit     Centrilobular emphysema (HCC)   Depression, major, recurrent, in partial remission (HCC)   Elevated BP without diagnosis of hypertension   Hyperlipidemia   Other Visit Diagnoses     Annual physical exam    -  Primary   Screening for colon cancer       Relevant Orders   Cologuard   Muscle spasm       Relevant Medications   tiZANidine (ZANAFLEX) 4 MG tablet   BMI 27.0-27.9,adult           Updated Health Maintenance information Reviewed recent lab results with patient Encouraged improvement to lifestyle with diet and exercise Goal of weight loss  Due for routine colon cancer screening. Last done >9 years ago colonoscopy, due for repeat - Discussion today about recommendations for either Colonoscopy or Cologuard screening, benefits and risks of screening, interested in Cologuard, understands that if positive then recommendation is for diagnostic colonoscopy to follow-up. - Ordered Cologuard today  Goal to monitor BP at home, goal < 140 /90 Caution w/ 5 hour energy drinks  Centrilobular Emphysema Tobacco Abuse  Keep on Breztri maintenance goal is 2 puff BID dosing if possible for daily use Tobacco Cessation  [place] Advice on future if/when ready she can pursue consult with Neuro vs Ortho when ready to discuss further and do testing. Handout AVS info given.   Orders Placed This Encounter  Procedures   Cologuard     Meds ordered this encounter  Medications   tiZANidine (ZANAFLEX) 4 MG tablet    Sig: Take 1 tablet (4 mg total) by mouth every 8 (eight) hours as needed for muscle spasms.    Dispense:  30 tablet    Refill:  2      Follow up plan: Return in about 1 year (around 04/21/2023) for 1 year fasting lab only then 1 week later Annual Physical.  Future labs 1 year.  ANobie Putnam [facility] 04/20/2022, 9:18 AM

## 2022-04-20 NOTE — Patient Instructions (Addendum)
Thank you for coming to the office today.  Keep an eye on BP to monitor, Goal < 140 / 90  Keep on Breztri as it is helping.  Recommend Shingles Vaccine - Shingrix 2 doses 2-6 months apart, can have flu like reaction, as the immunity build as a side effect. You can schedule with the pharmacy.  Future RSV vaccine in the fall.  ------------------------------------  [place]  [facility] [place]  [address] Phone: [phone]  [facility] - Neurology Dept [place], [place] [address] Phone: [phone]  ----------------------------------------------------------  Ordered the Cologuard (home kit) test for colon cancer screening. Stay tuned for further updates.  It will be shipped to you directly. If not received in 2-4 weeks, call us or the company.   If you send it back and no results are received in 2-4 weeks, call us or the company as well!   Colon Cancer Screening: - For all adults age 85+ routine colon cancer screening is highly recommended.     - Recent guidelines from [place] recommend starting age of 39 - Early detection of colon cancer is important, because often there are no warning signs or symptoms, also if found early usually it can be cured. Late stage is hard to treat.   - If Cologuard is NEGATIVE, then it is good for 3 years before next due - If Cologuard is POSITIVE, then it is strongly advised to get a Colonoscopy, which allows the GI doctor to locate the source of the cancer or polyp (even very early stage) and treat it by removing it. ------------------------- Follow instructions to collect sample, you may call the company for any help or questions, 24/7 telephone support at [phone].   Please schedule a Follow-up Appointment to: Return in about 1 year (around 04/21/2023) for 1 year fasting lab only then 1 week later Annual Physical.  If  you have any other questions or concerns, please feel free to call the office or send a message through [facility]. You may also schedule an earlier appointment if necessary.  Additionally, you may be receiving a survey about your experience at our office within a few days to 1 week by e-mail or mail. We value your feedback.  Jacqueline Putnam, DO [place]

## 2022-05-08 LAB — COLOGUARD: COLOGUARD: NEGATIVE

## 2022-11-25 ENCOUNTER — Encounter: Payer: Self-pay | Admitting: Radiology

## 2023-01-24 ENCOUNTER — Other Ambulatory Visit: Payer: Self-pay | Admitting: Family Medicine

## 2023-01-24 DIAGNOSIS — J432 Centrilobular emphysema: Secondary | ICD-10-CM

## 2023-01-25 NOTE — Telephone Encounter (Signed)
Requested medication (s) are due for refill today: Yes  Requested medication (s) are on the active medication list: Yes  Last refill:  01/09/23  Future visit scheduled: No  Notes to clinic:  See request.    Requested Prescriptions  Pending Prescriptions Disp Refills   BREZTRI AEROSPHERE 160-9-4.8 MCG/ACT AERO [Pharmacy Med Name: BREZTRI AEROSPHERE INHALER] 10.7 each 11    Sig: INHALE 2 PUFFS INTO THE LUNGS TWICE A DAY     Off-Protocol Failed - 01/24/2023 10:30 AM      Failed - Medication not assigned to a protocol, review manually.      Passed - Valid encounter within last 12 months    Recent Outpatient Visits           9 months ago Annual physical exam   [facility] Smitty Cords, DO   1 year ago Centrilobular emphysema [facility])   [facility] Smitty Cords, DO   3 years ago Chronic right shoulder pain   [facility] Smitty Cords, DO   4 years ago Lesion of skin of breast   [place] [facility], Netta Neat, DO   5 years ago Chronic obstructive pulmonary disease, unspecified COPD type [facility])   [facility], Netta Neat, [place]
